# Patient Record
Sex: Male | Born: 1965 | Hispanic: No | Marital: Married | Smoking: Never smoker
Health system: Southern US, Community
[De-identification: ages and names within clinical notes are randomized; demographics above are authoritative.]

## PROBLEM LIST (undated history)

## (undated) DIAGNOSIS — I10 Essential (primary) hypertension: Secondary | ICD-10-CM

## (undated) DIAGNOSIS — J309 Allergic rhinitis, unspecified: Secondary | ICD-10-CM

## (undated) DIAGNOSIS — F32A Depression, unspecified: Secondary | ICD-10-CM

## (undated) DIAGNOSIS — K219 Gastro-esophageal reflux disease without esophagitis: Secondary | ICD-10-CM

## (undated) DIAGNOSIS — F329 Major depressive disorder, single episode, unspecified: Secondary | ICD-10-CM

## (undated) DIAGNOSIS — J302 Other seasonal allergic rhinitis: Secondary | ICD-10-CM

## (undated) HISTORY — DX: Other seasonal allergic rhinitis: J30.2

## (undated) HISTORY — DX: Allergic rhinitis, unspecified: J30.9

## (undated) HISTORY — DX: Depression, unspecified: F32.A

## (undated) HISTORY — DX: Gastro-esophageal reflux disease without esophagitis: K21.9

## (undated) HISTORY — DX: Major depressive disorder, single episode, unspecified: F32.9

## (undated) HISTORY — DX: Essential (primary) hypertension: I10

---

## 1998-06-16 ENCOUNTER — Emergency Department (HOSPITAL_COMMUNITY): Admission: EM | Admit: 1998-06-16 | Discharge: 1998-06-16 | Payer: Self-pay | Admitting: Emergency Medicine

## 1999-12-20 ENCOUNTER — Emergency Department (HOSPITAL_COMMUNITY): Admission: EM | Admit: 1999-12-20 | Discharge: 1999-12-21 | Payer: Self-pay | Admitting: Emergency Medicine

## 2001-08-02 ENCOUNTER — Encounter: Payer: Self-pay | Admitting: Emergency Medicine

## 2001-08-02 ENCOUNTER — Emergency Department (HOSPITAL_COMMUNITY): Admission: EM | Admit: 2001-08-02 | Discharge: 2001-08-02 | Payer: Self-pay | Admitting: Emergency Medicine

## 2003-01-25 ENCOUNTER — Emergency Department (HOSPITAL_COMMUNITY): Admission: EM | Admit: 2003-01-25 | Discharge: 2003-01-25 | Payer: Self-pay | Admitting: Emergency Medicine

## 2003-01-25 ENCOUNTER — Encounter: Payer: Self-pay | Admitting: Emergency Medicine

## 2005-01-22 ENCOUNTER — Ambulatory Visit: Payer: Self-pay | Admitting: Internal Medicine

## 2005-06-10 ENCOUNTER — Ambulatory Visit: Payer: Self-pay | Admitting: Internal Medicine

## 2005-12-22 ENCOUNTER — Ambulatory Visit: Payer: Self-pay | Admitting: Internal Medicine

## 2006-03-05 ENCOUNTER — Ambulatory Visit: Payer: Self-pay | Admitting: Internal Medicine

## 2006-05-14 ENCOUNTER — Ambulatory Visit: Payer: Self-pay | Admitting: Internal Medicine

## 2006-11-25 ENCOUNTER — Emergency Department (HOSPITAL_COMMUNITY): Admission: EM | Admit: 2006-11-25 | Discharge: 2006-11-25 | Payer: Self-pay | Admitting: Emergency Medicine

## 2008-12-31 ENCOUNTER — Emergency Department (HOSPITAL_COMMUNITY): Admission: EM | Admit: 2008-12-31 | Discharge: 2008-12-31 | Payer: Self-pay | Admitting: Emergency Medicine

## 2008-12-31 ENCOUNTER — Ambulatory Visit: Payer: Self-pay | Admitting: Cardiovascular Disease

## 2009-01-03 ENCOUNTER — Ambulatory Visit: Payer: Self-pay

## 2009-01-07 ENCOUNTER — Ambulatory Visit: Payer: Self-pay | Admitting: Cardiovascular Disease

## 2009-09-20 ENCOUNTER — Telehealth: Payer: Self-pay | Admitting: Cardiovascular Disease

## 2010-06-17 ENCOUNTER — Telehealth: Payer: Self-pay | Admitting: Cardiovascular Disease

## 2011-01-11 ENCOUNTER — Encounter: Payer: Self-pay | Admitting: Internal Medicine

## 2011-01-20 NOTE — Progress Notes (Signed)
Summary: med question   Phone Note Call from Patient Call back at [phone]   Caller: Patient Reason for Call: Talk to Nurse Summary of Call: request to speak to nurse about med...Marland KitchenMarland KitchenAMLODIPINE BESYLATE 10 MG TABS, having temple and jaw pain, losing weight.... ongoing 2 weeks Initial call taken by: Migdalia Dk,  June 17, 2010 3:42 PM  Follow-up for Phone Call        left message to call back Dossie Arbour, RN, BSN  June 17, 2010 5:05 PM Spoke with pt who reports he saw a diet MD in [place] who started him on phendimetrazine. Pt. took it for 3 days and then stopped it about a week ago.  Pt. stopped Norvasc about a month ago and then resumed it about 2 weeks ago.  States he has been having pressure in temple area for about  2 weeks.  He has taken blood pressure and it runs 125/82 - 138/91.  Blood pressure today 133/88.  Pt has no primary care.  Pt states he can go see Dr. Earlene Plater at [facility].  Discussed with Dr. Clifton James and I instructed pt he should go to Urgent Care to have bp checked and to be evalulated. He is agreeable with this plan and will go to urgent care in AM.  Follow-up by: Dossie Arbour, RN, BSN,  June 17, 2010 5:42 PM

## 2011-04-06 LAB — DIFFERENTIAL
Basophils Relative: 0 % (ref 0–1)
Eosinophils Absolute: 0.5 10*3/uL (ref 0.0–0.7)
Eosinophils Relative: 5 % (ref 0–5)
Monocytes Absolute: 0.5 10*3/uL (ref 0.1–1.0)
Monocytes Relative: 5 % (ref 3–12)
Neutro Abs: 5.6 10*3/uL (ref 1.7–7.7)

## 2011-04-06 LAB — HEPATIC FUNCTION PANEL
AST: 34 U/L (ref 0–37)
Bilirubin, Direct: 0.1 mg/dL (ref 0.0–0.3)
Indirect Bilirubin: 0.7 mg/dL (ref 0.3–0.9)
Total Bilirubin: 0.8 mg/dL (ref 0.3–1.2)

## 2011-04-06 LAB — POCT I-STAT, CHEM 8
Creatinine, Ser: 1.1 mg/dL (ref 0.4–1.5)
Hemoglobin: 16 g/dL (ref 13.0–17.0)
Sodium: 141 mEq/L (ref 135–145)
TCO2: 29 mmol/L (ref 0–100)

## 2011-04-06 LAB — CBC
HCT: 45.7 % (ref 39.0–52.0)
Hemoglobin: 15.6 g/dL (ref 13.0–17.0)
MCHC: 34 g/dL (ref 30.0–36.0)
MCV: 90.3 fL (ref 78.0–100.0)
Platelets: 332 10*3/uL (ref 150–400)
RBC: 5.06 MIL/uL (ref 4.22–5.81)
RDW: 13.5 % (ref 11.5–15.5)
WBC: 9 10*3/uL (ref 4.0–10.5)

## 2011-04-06 LAB — POCT CARDIAC MARKERS
CKMB, poc: <1 ng/mL — ABNORMAL LOW (ref 1.0–8.0)
Myoglobin, poc: 46.3 ng/mL (ref 12–200)
Myoglobin, poc: 63 ng/mL (ref 12–200)

## 2011-05-05 NOTE — Consult Note (Signed)
[name], [name]                ACCOUNT NO.:  [id number]   MEDICAL RECORD NO.:  [id number]          PATIENT TYPE:  EMS   LOCATION:  ED                           FACILITY[facility]  [facility]   PHYSICIAN:  Verne Carrow, MDDATE OF BIRTH:  [birth year]   DATE OF CONSULTATION:  12/31/2008  DATE OF DISCHARGE:                                 CONSULTATION   REASON FOR CONSULTATION:  Chest pain.   HISTORY OF PRESENT ILLNESS:  [name] is a pleasant 45 year old  Hispanic male with no prior cardiac history who presented to the [facility] emergency department today with complaints of mild substernal chest  pressure that started last night.  Patient tells me that he had been in  his normal state of good health and was lying in bed last night when he  had the onset of substernal chest pressure.  He did have some dizziness  but had no associated shortness of breath, diaphoresis, palpitations,  near syncope or syncope.  He says that the pain lasted for several hours  and then he feel asleep.  When he awakened this morning he also noticed  some discomfort in his substernal region.  The pain does seem to worsen  by moving his left arm.  He tells me that he had lifted some heavy boxes  yesterday and feels that maybe this is related to a strained muscle.  He  does have a history of gastroesophageal reflux disease in the past and  states that this pain feels similar to that.  In the emergency  department his first set of cardiac enzymes have been negative.  His EKG  shows normal sinus rhythm without any evidence of ischemia.  Of note,  his systolic blood pressure has been elevated in the emergency  department and has ranged between 148 and 170.  Patient is currently  comfortable and is denying any significant chest pain.   PAST MEDICAL HISTORY:  1. Seasonal allergies.  2. Gastroesophageal reflux disease.  3. No prior history of hypertension, hyperlipidemia, diabetes      mellitus, tobacco use or  cardiac issues.   PAST SURGICAL HISTORY:  None.   ALLERGIES:  No known drug allergies.   HOME MEDICATIONS:  Allegra as needed.   SOCIAL HISTORY:  The patient lives in [place] with his wife and is  employed as a Corporate investment banker.  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  He denies the use of tobacco, alcohol, or  illicit drugs.   FAMILY HISTORY:  The patient's mother is alive and healthy.  He is not  aware of his father's health history.  There is no family history of  coronary artery disease or sudden cardiac death.   REVIEW OF SYSTEMS:  As stated in the history of present illness, is  otherwise negative.  A detailed 12-point review of systems is outlined  in the patient's written medical record.   PHYSICAL EXAMINATION:  VITAL SIGNS:  Temperature 98.0, pulse 71 and  regular, respirations 12 and unlabored, blood pressure 148/96.  GENERAL:  He is a pleasant, young, Hispanic  male in no acute distress.  He is alert and oriented x3.  PSYCHIATRIC:  Mood and affect are appropriate.  MUSCULOSKELETAL:  Muscle strength and tone is normal.  NEUROLOGIC:  No focal neurological deficits.  SKIN:  Warm and dry.  HEENT:  Normal.  NECK:  No JVD.  No carotid bruits.  No thyromegaly.  No lymphadenopathy.  LUNGS:  Clear to auscultation bilaterally without wheezes, rhonchi, or  crackles noted.  CARDIOVASCULAR:  Regular rate and rhythm without murmurs, gallops, or  rubs noted.  ABDOMEN:  Soft, nontender.  Bowel sounds are present.  EXTREMITIES:  No evidence of edema.  Pulses are at 2+ in all  extremities.  CHEST WALL:  There is reproducible chest pain by moving the patient's  arm up and down.   DIAGNOSTIC STUDIES:  1. A 12-lead EKG shows normal sinus rhythm with a ventricular rate of      83 beats per minute and no evidence of ischemia.  2. Laboratory values obtained at the time of admission show white      blood cell count of 9, hemoglobin 16, platelets 332.  Sodium  141,      potassium 3.7, BUN 11, creatinine 1.1.  Troponin less than 0.05.      CK-MB less than 1.0, lipase 16.   ASSESSMENT/PLAN:  Chest pain.  This patient's chest pain has many  atypical features.  It is most likely that this is musculoskeletal or  gastrointestinal in nature.  I cannot exclude the possibility of  underlying heart disease, although the patient has no known risk factors  for coronary artery disease.  Blood pressure is elevated today and this  suggests undiagnosed hypertension.  He has not had a recent workup to  look at his fasting lipid state.  I see no current evidence of ischemia  in his electrocardiogram and his first set of cardiac enzymes have been  negative.  I think it will be reasonable to discharge him to home from  the emergency department.  We have scheduled him to have an outpatient  stress test in our office at [facility] on Thursday, January 03, 2009 at 7:45 a.m.  I will have the patient follow up with me in my  office on Monday, January 07, 2009 at 11:15 a.m.  I have spoken to the  patient and his wife with this plan and they are in agreement.  I have  also suggested that patient obtain Prilosec over the counter and take  this on a daily basis to see if there is any resolution in his symptoms.  This plan was also discussed with Dr. Terressa Koyanagi who is on the  emergency department staff here at [facility].      Verne Carrow, MD  Electronically Signed     CM/MEDQ  D:  12/31/2008  T:  12/31/2008  Job:  [id number]

## 2011-05-05 NOTE — Assessment & Plan Note (Signed)
[facility]                            CARDIOLOGY OFFICE NOTE   NAME:REYESMayan, Kloepfer                         MRN:          [id number]  DATE:01/07/2009                            DOB:          06-Mar-1966    PRIMARY CARE PHYSICIAN:  None.   HISTORY OF PRESENT ILLNESS:  Mr. Padget is a pleasant 45 year old  Hispanic male with no prior cardiac history, who I was asked to see in  the [facility] Emergency Department on December 31, 2008.  The patient  presented to the emergency department with complaints of mild substernal  chest pressure as well as dizziness.  He at that time denied any  associated shortness of breath, diaphoresis, palpitations, near syncope,  or syncope.  He tells me that the chest pain started the night before  several hours before he went to sleep.  He went to sleep and had no  problems during the night.  When he awoke the next morning, he had  return of some of the chest discomfort.  He tells me at that time the  pain seemed to worsen when he moved his left arm.  He had been lifting  some heavy boxes the day before and felt that this was most likely  related to muscle strain, however, he wanted to have this evaluated.  He  was evaluated in the emergency department and Cardiology was consulted  for further recommendations.  His first set of cardiac enzymes were  negative in the emergency department.  His EKG showed normal sinus  rhythm without any evidence of ischemia.  I felt that it would be okay  to let him go home and have him come in for an outpatient stress test.  He had his Myoview performed on January 03, 2009.  The patient had an  adenosine test secondary to his blood pressure being elevated when he  arrived for his stress test.  His perfusion images showed no evidence of  ischemia.  His ejection fraction was noted to be 64%.  At the time of  his stress test, his blood pressure was 157/102.   The patient comes in today to review  the results of his stress test.  He  tells me that his chest pain has resolved since he was in the emergency  department.  He no longer has any pain when he moves his left or right  arms.  He also tells me that he has been drinking more water as  instructed by me in the emergency department and has had no dizziness.  He is symptom free at the current time.  He denies any dyspnea with  exertion, palpitations, dizziness, diaphoresis, near syncope, syncope,  orthopnea, PND, lower extremity edema, or chest pain.   PAST MEDICAL HISTORY:  1. Seasonal allergies.  2. GERD.  3. No prior history of hypertension, hyperlipidemia, diabetes      mellitus, tobacco use, or cardiac issues.   PAST SURGICAL HISTORY:  None.   ALLERGIES:  No known drug allergies.   HOME MEDICATIONS:  1. Allegra as needed.  2. Over-the-counter Prilosec.  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  He denies use of tobacco, alcohol, or illicit  drugs.   FAMILY HISTORY:  The patient's mother is alive and healthy.  He is not  aware of his father's health history.  There is no family history of  coronary artery disease or sudden cardiac death.   REVIEW OF SYSTEMS:  As stated in the history of present illness, is  otherwise negative.   PHYSICAL EXAMINATION:  VITAL SIGNS:  Blood pressure 150/100, pulse 76  and regular, and respirations 12 and unlabored.  GENERAL:  He is a pleasant, middle-aged Hispanic male in no acute  distress.  He is alert and oriented x3.  PSYCHIATRIC:  Mood and affect are appropriate.  NECK:  No JVD.  No carotid bruits.  No thyromegaly.  No lymphadenopathy.  SKIN:  Warm and dry.  LUNGS:  Clear to auscultation bilaterally without wheezes, rhonchi, or  crackles noted.  CARDIOVASCULAR:  Regular rate and rhythm without murmurs, gallops, or  rubs noted.  ABDOMEN:  Soft, nontender,  and nondistended.  Bowel sounds are present.  EXTREMITIES:  No evidence of edema.  Pulses are 2+ in all extremities.   DIAGNOSTIC STUDIES:  Adenosine Myoview performed on January 03, 2009,  showed no evidence of myocardial ischemia.  There was normal  contractility and thickening in all areas of myocardium.  The overall  left ventricular function was normal with an ejection fraction of 64%.  The patient's blood pressure at the time of the study was 157/102.  The  patient did have mild chest discomfort with infusion of the adenosine.  There were no EKG changes.  They were suggestive of ischemia.   ASSESSMENT AND PLAN:  This is a pleasant 45 year old Hispanic male with  no prior cardiac history, who returns today for review of an adenosine  Myoview stress test.  The stress test showed no evidence of myocardial  ischemia.  The patient's chest pain was most likely musculoskeletal and  has not recurred over the last week.  He does continue to have an  elevated blood pressure.  This has been documented in the emergency  department as well as during the day of his stress test and once again  today.  I have talked to the patient about finding a primary care  physician.  He has made plans to do this, this week.  It will be  important for him to be followed for his routine health screening as  well as to have his blood pressure followed closely.  I will start  Norvasc 5 mg once daily today.  The patient will have followup blood  work in the office of his primary care physician once this is  established.  I do not see any reason for him to be followed in our  office, but it was told him if he is unable to locate a primary care  physician, then we will be glad to follow him for his blood pressure in  our office.  The patient is  accompanied today by his wife and they both are in agreement to this  plan.  The patient will be seen in this office on an as-needed basis  unless he needs for [place] to follow  him more closely.     Verne Carrow, MD  Electronically Signed    CM/MedQ  DD: 01/07/2009  DT: 01/08/2009  Job #: [id number]

## 2011-06-26 ENCOUNTER — Telehealth: Payer: Self-pay | Admitting: Internal Medicine

## 2011-06-26 NOTE — Telephone Encounter (Signed)
Forwarded to Dr. Jonny Ruiz for patient's appt on 07-01-11.

## 2011-07-01 ENCOUNTER — Encounter: Payer: Self-pay | Admitting: Internal Medicine

## 2011-07-01 ENCOUNTER — Ambulatory Visit (INDEPENDENT_AMBULATORY_CARE_PROVIDER_SITE_OTHER): Payer: Self-pay | Admitting: Internal Medicine

## 2011-07-01 ENCOUNTER — Other Ambulatory Visit (INDEPENDENT_AMBULATORY_CARE_PROVIDER_SITE_OTHER): Payer: Self-pay

## 2011-07-01 ENCOUNTER — Other Ambulatory Visit: Payer: Self-pay | Admitting: Internal Medicine

## 2011-07-01 VITALS — BP 110/80 | HR 75 | Temp 98.3°F | Ht 67.0 in | Wt 209.4 lb

## 2011-07-01 DIAGNOSIS — Z0000 Encounter for general adult medical examination without abnormal findings: Secondary | ICD-10-CM | POA: Insufficient documentation

## 2011-07-01 DIAGNOSIS — R7309 Other abnormal glucose: Secondary | ICD-10-CM

## 2011-07-01 DIAGNOSIS — R7302 Impaired glucose tolerance (oral): Secondary | ICD-10-CM

## 2011-07-01 DIAGNOSIS — N200 Calculus of kidney: Secondary | ICD-10-CM | POA: Insufficient documentation

## 2011-07-01 DIAGNOSIS — F329 Major depressive disorder, single episode, unspecified: Secondary | ICD-10-CM

## 2011-07-01 DIAGNOSIS — I10 Essential (primary) hypertension: Secondary | ICD-10-CM

## 2011-07-01 DIAGNOSIS — J301 Allergic rhinitis due to pollen: Secondary | ICD-10-CM | POA: Insufficient documentation

## 2011-07-01 DIAGNOSIS — J309 Allergic rhinitis, unspecified: Secondary | ICD-10-CM

## 2011-07-01 DIAGNOSIS — K219 Gastro-esophageal reflux disease without esophagitis: Secondary | ICD-10-CM

## 2011-07-01 DIAGNOSIS — E785 Hyperlipidemia, unspecified: Secondary | ICD-10-CM | POA: Insufficient documentation

## 2011-07-01 HISTORY — DX: Allergic rhinitis, unspecified: J30.9

## 2011-07-01 HISTORY — DX: Essential (primary) hypertension: I10

## 2011-07-01 LAB — LIPID PANEL
Cholesterol: 230 mg/dL — ABNORMAL HIGH (ref 0–200)
VLDL: 29.8 mg/dL (ref 0.0–40.0)

## 2011-07-01 LAB — BASIC METABOLIC PANEL
BUN: 14 mg/dL (ref 6–23)
CO2: 30 mEq/L (ref 19–32)
Chloride: 102 mEq/L (ref 96–112)
Creatinine, Ser: 0.9 mg/dL (ref 0.4–1.5)

## 2011-07-01 LAB — HEMOGLOBIN A1C: Hgb A1c MFr Bld: 6 % (ref 4.6–6.5)

## 2011-07-01 MED ORDER — AMLODIPINE BESYLATE 10 MG PO TABS: 10.0000 mg | ORAL_TABLET | Freq: Every day | ORAL | Status: DC

## 2011-07-01 MED ORDER — FEXOFENADINE HCL 180 MG PO TABS: 180.0000 mg | ORAL_TABLET | Freq: Every day | ORAL | Status: DC

## 2011-07-01 NOTE — Assessment & Plan Note (Signed)
asympt   - for a1c today

## 2011-07-01 NOTE — Assessment & Plan Note (Deleted)

## 2011-07-01 NOTE — Assessment & Plan Note (Signed)
Prior resolved, no need tx at this time

## 2011-07-01 NOTE — Assessment & Plan Note (Signed)
stable overall by hx and exam, most recent data reviewed with pt, and pt to continue medical treatment as before  BP Readings from Last 3 Encounters:  07/01/11 110/80

## 2011-07-01 NOTE — Assessment & Plan Note (Signed)
asymtp - no meds needed at this time

## 2011-07-01 NOTE — Progress Notes (Signed)
Subjective:    Patient ID: [name], male    DOB: 09-28-1966, 45 y.o.   MRN: [id number]  HPI  Here for wellness and f/u;  Overall doing ok;  Pt denies CP, worsening SOB, DOE, wheezing, orthopnea, PND, worsening LE edema, palpitations, dizziness or syncope.  Pt denies neurological change such as new Headache, facial or extremity weakness.  Pt denies polydipsia, polyuria, or low sugar symptoms. Pt states overall good compliance with treatment and medications, good tolerability, and trying to follow lower cholesterol diet.  Pt denies worsening depressive symptoms, suicidal ideation or panic. No fever, wt loss, night sweats, loss of appetite, or other constitutional symptoms.  Pt states good ability with ADL's, low fall risk, home safety reviewed and adequate, no significant changes in hearing or vision, and occasionally active with exercise. Also mentions onset cough for 3 wks - ? Related to allergy? As he have run outside in the AM to help lose wt , but sometimes misses the allegra as well. Has lost 3-4 lbs already with running.  Pt denies fever, wt loss, night sweats, loss of appetite, or other constitutional symptoms Past Medical History  Diagnosis Date  . Seasonal allergies   . GERD (gastroesophageal reflux disease)   . Depression   . Hypertension   . HTN (hypertension) 07/01/2011  . Allergic rhinitis, cause unspecified 07/01/2011   No past surgical history on file.  reports that he has never smoked. He does not have any smokeless tobacco history on file. He reports that he does not drink alcohol or use illicit drugs. family history includes Hypertension in his father and mother.  There is no history of Coronary artery disease. No Known Allergies Current Outpatient Prescriptions on File Prior to Visit  Medication Sig Dispense Refill  . amLODipine (NORVASC) 10 MG tablet Take 10 mg by mouth daily.         Review of Systems Review of Systems  Constitutional: Negative for diaphoresis,  activity change, appetite change and unexpected weight change.  HENT: Negative for hearing loss, ear pain, facial swelling, mouth sores and neck stiffness.   Eyes: Negative for pain, redness and visual disturbance.  Respiratory: Negative for shortness of breath and wheezing.   Cardiovascular: Negative for chest pain and palpitations.  Gastrointestinal: Negative for diarrhea, blood in stool, abdominal distention and rectal pain.  Genitourinary: Negative for hematuria, flank pain and decreased urine volume.  Musculoskeletal: Negative for myalgias and joint swelling.  Skin: Negative for color change and wound.  Neurological: Negative for syncope and numbness.  Hematological: Negative for adenopathy.  Psychiatric/Behavioral: Negative for hallucinations, self-injury, decreased concentration and agitation.      Objective:   Physical Exam BP 110/80  Pulse 75  Temp(Src) 98.3 F (36.8 C) (Oral)  Ht 5\' 7"  (1.702 m)  Wt 209 lb 6 oz (94.972 kg)  BMI 32.79 kg/m2  SpO2 95% Physical Exam  VS noted Constitutional: Pt is oriented to person, place, and time. Appears well-developed and well-nourished.  HENT:  Head: Normocephalic and atraumatic.  Right Ear: External ear normal.  Left Ear: External ear normal.  Nose: Nose normal.  Mouth/Throat: Oropharynx is clear and moist.  Eyes: Conjunctivae and EOM are normal. Pupils are equal, round, and reactive to light.  Neck: Normal range of motion. Neck supple. No JVD present. No tracheal deviation present.  Cardiovascular: Normal rate, regular rhythm, normal heart sounds and intact distal pulses.   Pulmonary/Chest: Effort normal and breath sounds normal.  Abdominal: Soft. Bowel sounds are normal. There is  no tenderness.  Musculoskeletal: Normal range of motion. Exhibits no edema.  Lymphadenopathy:  Has no cervical adenopathy.  Neurological: Pt is alert and oriented to person, place, and time. Pt has normal reflexes. No cranial nerve deficit.  Skin: Skin  is warm and dry. No rash noted.  Psychiatric:  Has  normal mood and affect. Behavior is normal.         Assessment & Plan:

## 2011-07-01 NOTE — Assessment & Plan Note (Signed)
stable overall by hx and exam, , and pt to continue medical treatment as before   

## 2011-07-01 NOTE — Patient Instructions (Addendum)
Continue all other medications as before Please go to LAB in the Basement for the blood and/or urine tests to be done today Please call the phone number [phone] (the PhoneTree System) for results of testing in 2-3 days;  When calling, simply dial the number, and when prompted enter the MRN number above (the Medical Record Number) and the # key, then the message should start. Please return in 1 yr, or sooner if needed

## 2011-07-09 ENCOUNTER — Telehealth: Payer: Self-pay | Admitting: *Deleted

## 2011-07-09 DIAGNOSIS — E7800 Pure hypercholesterolemia, unspecified: Secondary | ICD-10-CM

## 2011-07-09 DIAGNOSIS — Z79899 Other long term (current) drug therapy: Secondary | ICD-10-CM

## 2011-07-09 NOTE — Telephone Encounter (Signed)
Pt inquiring about Rx for Lipitor.? Not on med list/will clarify w/Pt.

## 2011-07-11 ENCOUNTER — Emergency Department (HOSPITAL_COMMUNITY): Payer: Self-pay

## 2011-07-11 ENCOUNTER — Emergency Department (HOSPITAL_COMMUNITY)
Admission: EM | Admit: 2011-07-11 | Discharge: 2011-07-11 | Disposition: A | Discharge status: Home / Self Care | Payer: Self-pay | Attending: Emergency Medicine | Admitting: Emergency Medicine

## 2011-07-11 DIAGNOSIS — R109 Unspecified abdominal pain: Secondary | ICD-10-CM | POA: Insufficient documentation

## 2011-07-11 DIAGNOSIS — F411 Generalized anxiety disorder: Secondary | ICD-10-CM | POA: Insufficient documentation

## 2011-07-11 DIAGNOSIS — N201 Calculus of ureter: Secondary | ICD-10-CM | POA: Insufficient documentation

## 2011-07-11 LAB — URINALYSIS, ROUTINE W REFLEX MICROSCOPIC
Bilirubin Urine: NEGATIVE
Glucose, UA: NEGATIVE mg/dL
Ketones, ur: NEGATIVE mg/dL
pH: 7 (ref 5.0–8.0)

## 2011-07-11 LAB — URINE MICROSCOPIC-ADD ON

## 2011-07-22 NOTE — Telephone Encounter (Signed)
No hx of elev cholesterol, and no lipids done at [facility], so unless there is a record of prior tx (with records to show this, I could not prescribe)

## 2011-07-22 NOTE — Telephone Encounter (Signed)
Pt had labs done July 2012 and was advised by MD to consider Lipitor for elevated cholesterol, please advise

## 2011-07-22 NOTE — Telephone Encounter (Signed)
Sorry, he is correct, I missed the lipids just done July 2012  [facility] for lipitor 10 qd;  To f/u with labs only in 4 wks:  Lipids, 272.0, hepatic function panel v58.69 in 4 wks  Dahlia to do new rx, and arrange labs, and contact pt please

## 2011-07-23 MED ORDER — ATORVASTATIN CALCIUM 20 MG PO TABS: 20.0000 mg | ORAL_TABLET | Freq: Every day | ORAL | Status: DC

## 2011-07-23 NOTE — Telephone Encounter (Signed)
Pt advised of Rx/pharmacy as well as 4 week lab. Order placed.

## 2011-09-03 ENCOUNTER — Telehealth: Payer: Self-pay

## 2011-09-03 MED ORDER — LOVASTATIN 40 MG PO TABS: 40.0000 mg | ORAL_TABLET | Freq: Every day | ORAL | Status: DC

## 2011-09-03 NOTE — Telephone Encounter (Signed)
Patient would like alternative to Lipitor as too expensive. [facility] is his pharmacy.

## 2011-09-03 NOTE — Telephone Encounter (Signed)
Ok to change to [employer] - done per [employer]

## 2011-09-04 NOTE — Telephone Encounter (Signed)
Called patient informed prescription requested has been sent to pharmacy 

## 2012-07-19 ENCOUNTER — Other Ambulatory Visit: Payer: Self-pay | Admitting: Internal Medicine

## 2012-07-28 ENCOUNTER — Emergency Department (HOSPITAL_COMMUNITY)
Admission: EM | Admit: 2012-07-28 | Discharge: 2012-07-28 | Disposition: A | Discharge status: Home / Self Care | Payer: Self-pay | Attending: Emergency Medicine | Admitting: Emergency Medicine

## 2012-07-28 ENCOUNTER — Encounter (HOSPITAL_COMMUNITY): Payer: Self-pay | Admitting: Emergency Medicine

## 2012-07-28 DIAGNOSIS — F3289 Other specified depressive episodes: Secondary | ICD-10-CM | POA: Insufficient documentation

## 2012-07-28 DIAGNOSIS — F329 Major depressive disorder, single episode, unspecified: Secondary | ICD-10-CM | POA: Insufficient documentation

## 2012-07-28 DIAGNOSIS — I10 Essential (primary) hypertension: Secondary | ICD-10-CM | POA: Insufficient documentation

## 2012-07-28 DIAGNOSIS — Z79899 Other long term (current) drug therapy: Secondary | ICD-10-CM | POA: Insufficient documentation

## 2012-07-28 DIAGNOSIS — L237 Allergic contact dermatitis due to plants, except food: Secondary | ICD-10-CM

## 2012-07-28 DIAGNOSIS — L255 Unspecified contact dermatitis due to plants, except food: Secondary | ICD-10-CM | POA: Insufficient documentation

## 2012-07-28 DIAGNOSIS — K219 Gastro-esophageal reflux disease without esophagitis: Secondary | ICD-10-CM | POA: Insufficient documentation

## 2012-07-28 MED ORDER — PREDNISONE 10 MG PO TABS: 20.0000 mg | ORAL_TABLET | Freq: Every day | ORAL | Status: DC

## 2012-07-28 MED ORDER — HYDROXYZINE HCL 25 MG PO TABS
25.0000 mg | ORAL_TABLET | Freq: Once | ORAL | Status: AC
  Administered 2012-07-28: 25 mg via ORAL
  Filled 2012-07-28: qty 1

## 2012-07-28 MED ORDER — HYDROXYZINE HCL 25 MG PO TABS: 25.0000 mg | ORAL_TABLET | Freq: Four times a day (QID) | ORAL | Status: AC

## 2012-07-28 NOTE — ED Provider Notes (Signed)
History     CSN: [id number]  Arrival date & time 07/28/12  0049   First MD Initiated Contact with Patient 07/28/12 0143      Chief Complaint  Patient presents with  . Poison Ivy    (Consider location/radiation/quality/duration/timing/severity/associated sxs/prior treatment) HPI Comments: Patient with no significant past medical history presents emergency department with chief complaint of rash.  He states that him and his wife were doing yard work last week and eventually developed a very itchy, erythematous, pruritic rash on exposed areas including ankles and forearms.  In time patient states that he developed a rash in his groin area.  He denies fevers, night sweats, chills, myalgias, headache, nausea, vomiting, neck pain.  The history is provided by the patient.    Past Medical History  Diagnosis Date  . Seasonal allergies   . GERD (gastroesophageal reflux disease)   . Depression   . Hypertension   . HTN (hypertension) 07/01/2011  . Allergic rhinitis, cause unspecified 07/01/2011    History reviewed. No pertinent past surgical history.  Family History  Problem Relation Age of Onset  . Coronary artery disease Neg Hx   . Hypertension Mother   . Hypertension Father     History  Substance Use Topics  . Smoking status: Never Smoker   . Smokeless tobacco: Not on file  . Alcohol Use: No      Review of Systems  All other systems reviewed and are negative.    Allergies  Review of patient's allergies indicates no known allergies.  Home Medications   Current Outpatient Rx  Name Route Sig Dispense Refill  . AMLODIPINE BESYLATE 10 MG PO TABS  TAKE 1 TABLET (10 MG TOTAL) BY MOUTH DAILY. 90 tablet 0    Patient needs to schedule office visit.  Marland Kitchen FEXOFENADINE HCL 180 MG PO TABS Oral Take 1 tablet (180 mg total) by mouth daily. 90 tablet 3    BP 141/89  Pulse 82  Temp 98 F (36.7 C)  Resp 16  SpO2 99%  Physical Exam  Nursing note and vitals  reviewed. Constitutional: He is oriented to person, place, and time. He appears well-developed and well-nourished. No distress.  HENT:  Head: Normocephalic and atraumatic.  Mouth/Throat: Oropharynx is clear and moist and mucous membranes are normal.       No sign of airway obstruction. No edema of face, eyelids, lips, tongue, uvula.Marland Kitchen Uvula midline, no nasal congestion or drooling.  Tongue not elevated. No trismus.  Neck: Trachea normal, normal range of motion and full passive range of motion without pain. Neck supple. Carotid bruit is not present. No tracheal deviation present.       No carotid bruits or stridor  Cardiovascular: Normal rate, regular rhythm, intact distal pulses and normal pulses.        Not tachycardic  Pulmonary/Chest: Effort normal. No stridor.  Musculoskeletal: Normal range of motion.  Neurological: He is alert and oriented to person, place, and time.  Skin: Skin is warm and intact. Rash noted. Rash is pustular and vesicular. He is not diaphoretic.       Not diaphoretic. Raised erythematous, wheeping , pruritic in nature, located groin, ankles bilaterally and posterior forearms. no petechiae or purpura.   Psychiatric: He has a normal mood and affect. His behavior is normal.    ED Course  Procedures (including critical care time)  Labs Reviewed - No data to display No results found.   No diagnosis found.    MDM  Poison IVY  Pt presentation consistant with poison ivy infection. Discussed contagiousness & home care. Treated in ED w atarax. DC with recommendation to get OTC Zanfel. Script for PO prednisone taper Strict return precautions discussed. Pt afebrile and in NAD prior to dc. Airway intact without compromise.NO airway compromise.       Jaci Carrel, [place] 07/28/12 0234

## 2012-07-28 NOTE — ED Notes (Signed)
Pt alert, arrives from home, c/o rash to arms, legs, genitals, onset was yesterday, believed to be exposure to poison ivy, resp even unlabored, skin pwd

## 2012-07-29 NOTE — ED Provider Notes (Signed)
Medical screening examination/treatment/procedure(s) were performed by non-physician practitioner and as supervising physician I was immediately available for consultation/collaboration.  Mirza Fessel, MD 07/29/12 0847 

## 2012-11-12 ENCOUNTER — Other Ambulatory Visit: Payer: Self-pay | Admitting: Internal Medicine

## 2013-01-12 ENCOUNTER — Emergency Department (HOSPITAL_COMMUNITY)
Admission: EM | Admit: 2013-01-12 | Discharge: 2013-01-13 | Disposition: A | Discharge status: Home / Self Care | Payer: Self-pay | Attending: Emergency Medicine | Admitting: Emergency Medicine

## 2013-01-12 ENCOUNTER — Encounter (HOSPITAL_COMMUNITY): Payer: Self-pay | Admitting: *Deleted

## 2013-01-12 DIAGNOSIS — Z8719 Personal history of other diseases of the digestive system: Secondary | ICD-10-CM | POA: Insufficient documentation

## 2013-01-12 DIAGNOSIS — Z91199 Patient's noncompliance with other medical treatment and regimen due to unspecified reason: Secondary | ICD-10-CM | POA: Insufficient documentation

## 2013-01-12 DIAGNOSIS — R51 Headache: Secondary | ICD-10-CM | POA: Insufficient documentation

## 2013-01-12 DIAGNOSIS — Z9119 Patient's noncompliance with other medical treatment and regimen: Secondary | ICD-10-CM | POA: Insufficient documentation

## 2013-01-12 DIAGNOSIS — Z79899 Other long term (current) drug therapy: Secondary | ICD-10-CM | POA: Insufficient documentation

## 2013-01-12 DIAGNOSIS — I10 Essential (primary) hypertension: Secondary | ICD-10-CM | POA: Insufficient documentation

## 2013-01-12 DIAGNOSIS — Z8659 Personal history of other mental and behavioral disorders: Secondary | ICD-10-CM | POA: Insufficient documentation

## 2013-01-12 DIAGNOSIS — Z9114 Patient's other noncompliance with medication regimen: Secondary | ICD-10-CM

## 2013-01-12 NOTE — ED Notes (Signed)
Pt c/o headache all day; feels like his heart beating in his head; has not taken bp pill x 3 days because can't afford bp pills; increased pain when leans forward

## 2013-01-13 LAB — POCT I-STAT, CHEM 8
Creatinine, Ser: 1 mg/dL (ref 0.50–1.35)
Glucose, Bld: 129 mg/dL — ABNORMAL HIGH (ref 70–99)
Hemoglobin: 16.3 g/dL (ref 13.0–17.0)
Sodium: 142 mEq/L (ref 135–145)
TCO2: 28 mmol/L (ref 0–100)

## 2013-01-13 MED ORDER — ACETAMINOPHEN 325 MG PO TABS
650.0000 mg | ORAL_TABLET | Freq: Once | ORAL | Status: AC
  Administered 2013-01-13: 650 mg via ORAL
  Filled 2013-01-13: qty 2

## 2013-01-13 MED ORDER — AMLODIPINE BESYLATE 10 MG PO TABS: 10.0000 mg | ORAL_TABLET | Freq: Every day | ORAL | Status: DC

## 2013-01-13 MED ORDER — LISINOPRIL-HYDROCHLOROTHIAZIDE 10-12.5 MG PO TABS: 1.0000 | ORAL_TABLET | Freq: Every day | ORAL | Status: DC

## 2013-01-13 NOTE — ED Provider Notes (Signed)
Medical screening examination/treatment/procedure(s) were performed by non-physician practitioner and as supervising physician I was immediately available for consultation/collaboration.   Laray Anger, DO 01/13/13 1623

## 2013-01-13 NOTE — ED Provider Notes (Signed)
History     CSN: [id number]  Arrival date & time 01/12/13  2023   First MD Initiated Contact with Patient 01/12/13 2339      Chief Complaint  Patient presents with  . Headache    (Consider location/radiation/quality/duration/timing/severity/associated sxs/prior treatment) HPI Comments: [name] is a 47 year old male patient with a history of hypertension who presents to the emergency department complaining of a headache that has been going on all day. The headache feels like his heart is beating in his head. He states he has had headaches similar to this in the past when he had stopped taking his blood pressure medication.  He has not taken his blood pressure medication, Norvasc, in three days because he states he cant afford the pills.  He denies any associated symptoms.  He denies nausea, vomiting, photophobia, shortness of breath, fever, chills and neck pain.     Past Medical History  Diagnosis Date  . Seasonal allergies   . GERD (gastroesophageal reflux disease)   . Depression   . Hypertension   . HTN (hypertension) 07/01/2011  . Allergic rhinitis, cause unspecified 07/01/2011    History reviewed. No pertinent past surgical history.  Family History  Problem Relation Age of Onset  . Coronary artery disease Neg Hx   . Hypertension Mother   . Hypertension Father     History  Substance Use Topics  . Smoking status: Never Smoker   . Smokeless tobacco: Not on file  . Alcohol Use: No      Review of Systems  Constitutional: Negative for fever, chills, diaphoresis, activity change and fatigue.  HENT: Negative for neck pain and neck stiffness.   Respiratory: Negative for shortness of breath.   Cardiovascular: Negative for chest pain.  Gastrointestinal: Negative for nausea and vomiting.  Neurological: Positive for headaches. Negative for dizziness, syncope, speech difficulty, weakness, light-headedness and numbness.  All other systems reviewed and are  negative.    Allergies  Review of patient's allergies indicates no known allergies.  Home Medications   Current Outpatient Rx  Name  Route  Sig  Dispense  Refill  . AMLODIPINE BESYLATE 10 MG PO TABS   Oral   Take 10 mg by mouth daily.         . IBUPROFEN 200 MG PO TABS   Oral   Take 800 mg by mouth every 6 (six) hours as needed. For headache         . LISINOPRIL-HYDROCHLOROTHIAZIDE 10-12.5 MG PO TABS   Oral   Take 1 tablet by mouth daily.   30 tablet   2     BP 144/89  Pulse 70  Temp 97.9 F (36.6 C)  Resp 18  SpO2 96%  Physical Exam  Constitutional: He is oriented to person, place, and time. He appears well-developed and well-nourished. No distress.  HENT:  Head: Normocephalic and atraumatic.  Eyes: Conjunctivae normal and EOM are normal. Pupils are equal, round, and reactive to light.  Neck: Normal range of motion.  Cardiovascular: Normal rate, regular rhythm, normal heart sounds and intact distal pulses.   Pulmonary/Chest: Effort normal and breath sounds normal. No respiratory distress. He has no wheezes.  Neurological: He is alert and oriented to person, place, and time. He has normal reflexes. No cranial nerve deficit. Coordination normal.  Skin: Skin is warm and dry. He is not diaphoretic.    ED Course  Procedures (including critical care time)  Labs Reviewed  POCT I-STAT, CHEM 8 - Abnormal; Notable  for the following:    Glucose, Bld 129 (*)     All other components within normal limits   No results found.   1. Headache   2. Noncompliance with medication regimen     ED ECG REPORT   Date: 01/13/2013  EKG Time: 3:46 AM  Rate: 67  Rhythm: normal sinus rhythm,  there are no previous tracings available for comparison  Axis: noraml  Intervals:none  ST&T Change: none  Narrative Interpretation: normal            MDM  HTN, medication non compliance         Arman Filter, NP 01/13/13 [phone]

## 2013-02-08 DIAGNOSIS — T502X5A Adverse effect of carbonic-anhydrase inhibitors, benzothiadiazides and other diuretics, initial encounter: Secondary | ICD-10-CM | POA: Insufficient documentation

## 2013-02-08 DIAGNOSIS — Z8659 Personal history of other mental and behavioral disorders: Secondary | ICD-10-CM | POA: Insufficient documentation

## 2013-02-08 DIAGNOSIS — Z8709 Personal history of other diseases of the respiratory system: Secondary | ICD-10-CM | POA: Insufficient documentation

## 2013-02-08 DIAGNOSIS — Z8719 Personal history of other diseases of the digestive system: Secondary | ICD-10-CM | POA: Insufficient documentation

## 2013-02-08 DIAGNOSIS — Z79899 Other long term (current) drug therapy: Secondary | ICD-10-CM | POA: Insufficient documentation

## 2013-02-08 DIAGNOSIS — I10 Essential (primary) hypertension: Secondary | ICD-10-CM | POA: Insufficient documentation

## 2013-02-08 DIAGNOSIS — R51 Headache: Secondary | ICD-10-CM | POA: Insufficient documentation

## 2013-02-08 DIAGNOSIS — R110 Nausea: Secondary | ICD-10-CM | POA: Insufficient documentation

## 2013-02-09 ENCOUNTER — Encounter (HOSPITAL_COMMUNITY): Payer: Self-pay | Admitting: *Deleted

## 2013-02-09 ENCOUNTER — Emergency Department (HOSPITAL_COMMUNITY)
Admission: EM | Admit: 2013-02-09 | Discharge: 2013-02-09 | Disposition: A | Discharge status: Home / Self Care | Payer: Self-pay | Attending: Emergency Medicine | Admitting: Emergency Medicine

## 2013-02-09 DIAGNOSIS — T887XXA Unspecified adverse effect of drug or medicament, initial encounter: Secondary | ICD-10-CM

## 2013-02-09 DIAGNOSIS — I10 Essential (primary) hypertension: Secondary | ICD-10-CM

## 2013-02-09 MED ORDER — AMLODIPINE BESYLATE 10 MG PO TABS: 10.0000 mg | ORAL_TABLET | Freq: Every day | ORAL | Status: DC

## 2013-02-09 NOTE — ED Notes (Signed)
Pt states he was seen Feb 1st and started on lisinopril, he states pressure is high and he wants meds changed, headache and nausea

## 2013-02-09 NOTE — ED Provider Notes (Signed)
Medical screening examination/treatment/procedure(s) were performed by non-physician practitioner and as supervising physician I was immediately available for consultation/collaboration.  Taline Nass, MD 02/09/13 0658 

## 2013-02-09 NOTE — ED Provider Notes (Signed)
History     CSN: [id number]  Arrival date & time 02/08/13  2325   First MD Initiated Contact with Patient 02/09/13 0148      Chief Complaint  Patient presents with  . Hypertension   HPI  History provided by the patient. Patient is a 47 year old male with history of hypertension presents with requests for a change in his blood pressure medication. Patient states that he had been on amlodipine 10 mg for over 3 years for his blood pressure. Patient states that is controlled his symptoms quite well but more recently visited the emergency room to have a refill of his medications due to loss of insurance and was given a new prescription for lisinopril and hydrochlorothiazide. Patient states that since taking this medicine he has had headaches every day. These are improved with ibuprofen but he has been dissatisfied with the new medication. He also reports that occasionally causes him to feel nauseated. Currently he denies headache or nausea. He denies any other symptoms. Denies chest pain, shortness of breath or heart palpitations.     Past Medical History  Diagnosis Date  . Seasonal allergies   . GERD (gastroesophageal reflux disease)   . Depression   . Hypertension   . HTN (hypertension) 07/01/2011  . Allergic rhinitis, cause unspecified 07/01/2011    History reviewed. No pertinent past surgical history.  Family History  Problem Relation Age of Onset  . Coronary artery disease Neg Hx   . Hypertension Mother   . Hypertension Father     History  Substance Use Topics  . Smoking status: Never Smoker   . Smokeless tobacco: Not on file  . Alcohol Use: No      Review of Systems  All other systems reviewed and are negative.    Allergies  Review of patient's allergies indicates no known allergies.  Home Medications   Current Outpatient Rx  Name  Route  Sig  Dispense  Refill  . ibuprofen (ADVIL,MOTRIN) 200 MG tablet   Oral   Take 800 mg by mouth every 6 (six) hours as  needed. For headache         . lisinopril-hydrochlorothiazide (PRINZIDE,ZESTORETIC) 10-12.5 MG per tablet   Oral   Take 1 tablet by mouth daily.   30 tablet   2     BP 143/92  Pulse 71  Temp(Src) 98.1 F (36.7 C) (Oral)  Resp 18  SpO2 98%  Physical Exam  Nursing note and vitals reviewed. Constitutional: He is oriented to person, place, and time. He appears well-developed and well-nourished. No distress.  HENT:  Head: Normocephalic.  Eyes: Conjunctivae and EOM are normal. Pupils are equal, round, and reactive to light.  Cardiovascular: Normal rate and regular rhythm.   No murmur heard. Pulmonary/Chest: Effort normal and breath sounds normal. No respiratory distress. He has no wheezes. He has no rales.  Abdominal: Soft.  Musculoskeletal: Normal range of motion.  Neurological: He is alert and oriented to person, place, and time.  Skin: Skin is warm. He is not diaphoretic.  Psychiatric: He has a normal mood and affect. His behavior is normal.    ED Course  Procedures    1. Hypertension   2. Medication side effect       MDM  2:15 AM patient seen and evaluated. Patient currently without any complaints. He does not wish to have any testing or procedures. He wishes to only have a prescription change if possible.        Angus Seller,  PA 02/09/13 0231

## 2013-05-04 ENCOUNTER — Emergency Department (HOSPITAL_COMMUNITY)
Admission: EM | Admit: 2013-05-04 | Discharge: 2013-05-04 | Disposition: A | Discharge status: Home / Self Care | Payer: Self-pay | Attending: Emergency Medicine | Admitting: Emergency Medicine

## 2013-05-04 ENCOUNTER — Encounter (HOSPITAL_COMMUNITY): Payer: Self-pay | Admitting: Emergency Medicine

## 2013-05-04 DIAGNOSIS — J309 Allergic rhinitis, unspecified: Secondary | ICD-10-CM | POA: Insufficient documentation

## 2013-05-04 DIAGNOSIS — F3289 Other specified depressive episodes: Secondary | ICD-10-CM | POA: Insufficient documentation

## 2013-05-04 DIAGNOSIS — Z79899 Other long term (current) drug therapy: Secondary | ICD-10-CM | POA: Insufficient documentation

## 2013-05-04 DIAGNOSIS — Z91199 Patient's noncompliance with other medical treatment and regimen due to unspecified reason: Secondary | ICD-10-CM | POA: Insufficient documentation

## 2013-05-04 DIAGNOSIS — I10 Essential (primary) hypertension: Secondary | ICD-10-CM | POA: Insufficient documentation

## 2013-05-04 DIAGNOSIS — F329 Major depressive disorder, single episode, unspecified: Secondary | ICD-10-CM | POA: Insufficient documentation

## 2013-05-04 DIAGNOSIS — R110 Nausea: Secondary | ICD-10-CM | POA: Insufficient documentation

## 2013-05-04 DIAGNOSIS — R51 Headache: Secondary | ICD-10-CM

## 2013-05-04 DIAGNOSIS — K219 Gastro-esophageal reflux disease without esophagitis: Secondary | ICD-10-CM | POA: Insufficient documentation

## 2013-05-04 DIAGNOSIS — Z9119 Patient's noncompliance with other medical treatment and regimen: Secondary | ICD-10-CM | POA: Insufficient documentation

## 2013-05-04 LAB — CBC WITH DIFFERENTIAL/PLATELET
Basophils Relative: 0 % (ref 0–1)
Eosinophils Absolute: 0.5 10*3/uL (ref 0.0–0.7)
MCH: 29.9 pg (ref 26.0–34.0)
MCHC: 35.6 g/dL (ref 30.0–36.0)
Neutrophils Relative %: 55 % (ref 43–77)
Platelets: 380 10*3/uL (ref 150–400)
RDW: 13.2 % (ref 11.5–15.5)

## 2013-05-04 LAB — BASIC METABOLIC PANEL
GFR calc Af Amer: >90 mL/min (ref >90)
GFR calc non Af Amer: >90 mL/min (ref >90)
Potassium: 3.7 mEq/L (ref 3.5–5.1)
Sodium: 141 mEq/L (ref 135–145)

## 2013-05-04 MED ORDER — AMLODIPINE BESYLATE 10 MG PO TABS: 10.0000 mg | ORAL_TABLET | Freq: Every day | ORAL | Status: DC

## 2013-05-04 MED ORDER — LABETALOL HCL 5 MG/ML IV SOLN
20.0000 mg | Freq: Once | INTRAVENOUS | Status: AC
  Administered 2013-05-04: 20 mg via INTRAVENOUS
  Filled 2013-05-04: qty 4

## 2013-05-04 NOTE — ED Notes (Signed)
Pt presenting to ed with c/o hypertension out of his bp medications x 3 months due to not being able to afford medications. Pt states positive headache and positive nausea no vomiting pt denies chest pain at this time

## 2013-05-04 NOTE — ED Notes (Signed)
Per pt, has been out of BP meds for 3 months-unable to see MD for scripts due to financial reasons, has no insurance-c/o headache

## 2013-05-04 NOTE — ED Provider Notes (Signed)
History     CSN: [id number]  Arrival date & time 05/04/13  1754   First MD Initiated Contact with Patient 05/04/13 1800      Chief Complaint  Patient presents with  . Hypertension  . Headache    (Consider location/radiation/quality/duration/timing/severity/associated sxs/prior treatment) HPI Comments: Patient presents to the ER for evaluation of headache. Patient reports a throbbing posterior headache since yesterday. He reports that he has had this happen before when he is out of his blood pressure medication. He has not had similar pain for approximately 3 months. Patient denies vision change, dizziness and passing out. He has not had any chest pain, shortness of breath. Pain has gotten severe, however. She reports nausea but no vomiting associated with the symptoms.  Patient is a 47 y.o. male presenting with hypertension and headaches.  Hypertension Associated symptoms include headaches.  Headache Associated symptoms: nausea   Associated symptoms: no fever and no neck pain     Past Medical History  Diagnosis Date  . Seasonal allergies   . GERD (gastroesophageal reflux disease)   . Depression   . Hypertension   . HTN (hypertension) 07/01/2011  . Allergic rhinitis, cause unspecified 07/01/2011    History reviewed. No pertinent past surgical history.  Family History  Problem Relation Age of Onset  . Coronary artery disease Neg Hx   . Hypertension Mother   . Hypertension Father     History  Substance Use Topics  . Smoking status: Never Smoker   . Smokeless tobacco: Not on file  . Alcohol Use: No      Review of Systems  Constitutional: Negative for fever.  HENT: Negative for neck pain.   Gastrointestinal: Positive for nausea.  Neurological: Positive for headaches.  All other systems reviewed and are negative.    Allergies  Review of patient's allergies indicates no known allergies.  Home Medications   Current Outpatient Rx  Name  Route  Sig  Dispense   Refill  . amLODipine (NORVASC) 10 MG tablet   Oral   Take 1 tablet (10 mg total) by mouth daily.   30 tablet   0   . ibuprofen (ADVIL,MOTRIN) 200 MG tablet   Oral   Take 800 mg by mouth every 6 (six) hours as needed. For headache         . lisinopril-hydrochlorothiazide (PRINZIDE,ZESTORETIC) 10-12.5 MG per tablet   Oral   Take 1 tablet by mouth daily.   30 tablet   2     BP 149/136  Pulse 75  Temp(Src) 97.8 F (36.6 C) (Oral)  Resp 20  SpO2 96%  Physical Exam  Constitutional: He is oriented to person, place, and time. He appears well-developed and well-nourished. No distress.  HENT:  Head: Normocephalic and atraumatic.  Right Ear: Hearing normal.  Left Ear: Hearing normal.  Nose: Nose normal.  Mouth/Throat: Oropharynx is clear and moist and mucous membranes are normal.  Eyes: Conjunctivae and EOM are normal. Pupils are equal, round, and reactive to light.  Neck: Normal range of motion. Neck supple.  Cardiovascular: Regular rhythm, S1 normal and S2 normal.  Exam reveals no gallop and no friction rub.   No murmur heard. Pulmonary/Chest: Effort normal and breath sounds normal. No respiratory distress. He exhibits no tenderness.  Abdominal: Soft. Normal appearance and bowel sounds are normal. There is no hepatosplenomegaly. There is no tenderness. There is no rebound, no guarding, no tenderness at McBurney's point and negative Murphy's sign. No hernia.  Musculoskeletal: Normal range of  motion.  Neurological: He is alert and oriented to person, place, and time. He has normal strength. No cranial nerve deficit or sensory deficit. Coordination normal. GCS eye subscore is 4. GCS verbal subscore is 5. GCS motor subscore is 6.  Skin: Skin is warm, dry and intact. No rash noted. No cyanosis.  Psychiatric: He has a normal mood and affect. His speech is normal and behavior is normal. Thought content normal.    ED Course  Procedures (including critical care time)  Labs Reviewed   CBC WITH DIFFERENTIAL - Abnormal; Notable for the following:    WBC 11.2 (*)    All other components within normal limits  BASIC METABOLIC PANEL   No results found.   Diagnosis: Uncontrolled hypertension; headache    MDM  Patient presents with headache for 2 days that he associates with elevated blood pressure. Patient reports that in the past he has had a similar headache when he has been off of his blood pressure medication. He reports that he did lose his job last year and has run of his medications for the last 3 months. He was previously well controlled on the amlodipine. Patient has a normal neurologic examination. He was administered labetalol and his blood pressure improved to 129/80. His headache also resolved. Patient will be given a prescription for amlodipine, followup in adult care center.        Gilda Crease, MD 05/04/13 [phone]

## 2013-12-02 ENCOUNTER — Ambulatory Visit: Payer: Self-pay | Admitting: Family Medicine

## 2013-12-02 VITALS — BP 172/84 | HR 73 | Temp 98.2°F | Resp 16 | Ht 67.5 in | Wt 212.0 lb

## 2013-12-02 DIAGNOSIS — I10 Essential (primary) hypertension: Secondary | ICD-10-CM

## 2013-12-02 MED ORDER — AMLODIPINE BESYLATE 10 MG PO TABS: 10.0000 mg | ORAL_TABLET | Freq: Every day | ORAL | Status: DC

## 2013-12-02 NOTE — Patient Instructions (Signed)
Continue same medication  Return in 6 months.  Monitor BP at home.

## 2013-12-02 NOTE — Progress Notes (Signed)
Subjective: Patient has been out of blood pressure medicine for a fall. He does not have a job currently. He may have to move to [place] for a job. He feels fine. No major plates except when his temperature goes up he gets a headache and some in jaw pain.    He does not get a lot of regular exercise. He does try eat rightand they do not eat out.  Objective: Pleasant man in no acute distress. TMs normal. Throat clear. Neck supple without nodes thyromegaly. No carotid bruits. Chest is clear to auscultation. Heart regular without murmurs. Abdomen soft without mass or tenderness. No edema.  Assessment: Hypertension  Plan: Continue same medications for one year but advised him to get a recheck in about 6 months if he can afford it so.

## 2014-12-03 ENCOUNTER — Other Ambulatory Visit: Payer: Self-pay | Admitting: Family Medicine

## 2015-01-09 ENCOUNTER — Other Ambulatory Visit: Payer: Self-pay | Admitting: Family Medicine

## 2015-01-25 ENCOUNTER — Emergency Department (HOSPITAL_COMMUNITY)
Admission: EM | Admit: 2015-01-25 | Discharge: 2015-01-25 | Disposition: A | Discharge status: Home / Self Care | Payer: Self-pay | Attending: Emergency Medicine | Admitting: Emergency Medicine

## 2015-01-25 ENCOUNTER — Encounter (HOSPITAL_COMMUNITY): Payer: Self-pay | Admitting: Emergency Medicine

## 2015-01-25 ENCOUNTER — Emergency Department (HOSPITAL_COMMUNITY): Payer: Self-pay

## 2015-01-25 DIAGNOSIS — Z8659 Personal history of other mental and behavioral disorders: Secondary | ICD-10-CM | POA: Insufficient documentation

## 2015-01-25 DIAGNOSIS — Z79899 Other long term (current) drug therapy: Secondary | ICD-10-CM | POA: Insufficient documentation

## 2015-01-25 DIAGNOSIS — R079 Chest pain, unspecified: Secondary | ICD-10-CM | POA: Insufficient documentation

## 2015-01-25 DIAGNOSIS — R42 Dizziness and giddiness: Secondary | ICD-10-CM | POA: Insufficient documentation

## 2015-01-25 DIAGNOSIS — R739 Hyperglycemia, unspecified: Secondary | ICD-10-CM | POA: Insufficient documentation

## 2015-01-25 DIAGNOSIS — Z8719 Personal history of other diseases of the digestive system: Secondary | ICD-10-CM | POA: Insufficient documentation

## 2015-01-25 DIAGNOSIS — I159 Secondary hypertension, unspecified: Secondary | ICD-10-CM | POA: Insufficient documentation

## 2015-01-25 LAB — I-STAT TROPONIN, ED: Troponin i, poc: 0 ng/mL (ref 0.00–0.08)

## 2015-01-25 LAB — CBC
HCT: 45.4 % (ref 39.0–52.0)
HEMOGLOBIN: 15.5 g/dL (ref 13.0–17.0)
MCH: 29.6 pg (ref 26.0–34.0)
MCHC: 34.1 g/dL (ref 30.0–36.0)
MCV: 86.6 fL (ref 78.0–100.0)
Platelets: 391 10*3/uL (ref 150–400)
RBC: 5.24 MIL/uL (ref 4.22–5.81)
RDW: 13.8 % (ref 11.5–15.5)
WBC: 9.1 10*3/uL (ref 4.0–10.5)

## 2015-01-25 LAB — BASIC METABOLIC PANEL
Anion gap: 9 (ref 5–15)
BUN: 12 mg/dL (ref 6–23)
CO2: 25 mmol/L (ref 19–32)
Calcium: 9.1 mg/dL (ref 8.4–10.5)
Chloride: 103 mmol/L (ref 96–112)
Creatinine, Ser: 0.82 mg/dL (ref 0.50–1.35)
GFR calc Af Amer: >90 mL/min (ref >90)
GFR calc non Af Amer: >90 mL/min (ref >90)
GLUCOSE: 181 mg/dL — AB (ref 70–99)
Potassium: 3.8 mmol/L (ref 3.5–5.1)
Sodium: 137 mmol/L (ref 135–145)

## 2015-01-25 MED ORDER — AMLODIPINE BESYLATE 10 MG PO TABS: ORAL_TABLET | ORAL | Status: DC

## 2015-01-25 NOTE — ED Provider Notes (Signed)
CSN: [id number]     Arrival date & time 01/25/15  1425 History   First MD Initiated Contact with Patient 01/25/15 1520     Chief Complaint  Patient presents with  . Chest Pain  . Dizziness     (Consider location/radiation/quality/duration/timing/severity/associated sxs/prior Treatment) HPI  [name] is a 49 y.o. male who presents for dizziness, which is worse with the supine position.  The dizziness is a spinning sensation.  He also had some lightheadedness several days ago when he is standing up after working on the computer.  He knows some chest tightness, which feels like indigestion in the last several days.  He denies fever, chills, cough, shortness of breath, back pain, paresthesias or problems walking.  He complains of stress secondary to financial problems.  He does not have a primary care doctor, and needs a refill on his amlodipine.  There are no other known modifying factors.    Past Medical History  Diagnosis Date  . Seasonal allergies   . GERD (gastroesophageal reflux disease)   . Depression   . Hypertension   . HTN (hypertension) 07/01/2011  . Allergic rhinitis, cause unspecified 07/01/2011   History reviewed. No pertinent past surgical history. Family History  Problem Relation Age of Onset  . Coronary artery disease Neg Hx   . Hypertension Mother   . Hypertension Father    History  Substance Use Topics  . Smoking status: Never Smoker   . Smokeless tobacco: Not on file  . Alcohol Use: No    Review of Systems  All other systems reviewed and are negative.     Allergies  Review of patient's allergies indicates no known allergies.  Home Medications   Prior to Admission medications   Medication Sig Start Date End Date Taking? Authorizing Provider  ibuprofen (ADVIL,MOTRIN) 200 MG tablet Take 800 mg by mouth every 6 (six) hours as needed. For headache   Yes Historical Provider, MD  amLODipine (NORVASC) 10 MG tablet 1 each day for hypertension 01/25/15    Flint Melter, MD   BP 130/68 mmHg  Pulse 82  Temp(Src) 98.3 F (36.8 C) (Oral)  Resp 16  Wt 208 lb 6 oz (94.518 kg)  SpO2 98% Physical Exam  Constitutional: He is oriented to person, place, and time. He appears well-developed and well-nourished. No distress.  HENT:  Head: Normocephalic and atraumatic.  Right Ear: External ear normal.  Left Ear: External ear normal.  Eyes: Conjunctivae and EOM are normal. Pupils are equal, round, and reactive to light.  Neck: Normal range of motion and phonation normal. Neck supple.  Cardiovascular: Normal rate, regular rhythm and normal heart sounds.   Pulmonary/Chest: Effort normal and breath sounds normal. He exhibits no bony tenderness.  Abdominal: Soft. There is no tenderness.  Musculoskeletal: Normal range of motion.  Neurological: He is alert and oriented to person, place, and time. No cranial nerve deficit or sensory deficit. He exhibits normal muscle tone. Coordination normal.  No dysarthria, aphasia or nystagmus.  Skin: Skin is warm, dry and intact.  Psychiatric: He has a normal mood and affect. His behavior is normal. Judgment and thought content normal.  Nursing note and vitals reviewed.   ED Course  Procedures (including critical care time) Labs Review Labs Reviewed  BASIC METABOLIC PANEL - Abnormal; Notable for the following:    Glucose, Bld 181 (*)    All other components within normal limits  CBC  I-STAT TROPOININ, ED    Imaging Review Dg Chest 2  View  01/25/2015   CLINICAL DATA:  Chest pressure and nausea  EXAM: CHEST  2 VIEW  COMPARISON:  December 31, 2008  FINDINGS: There is no edema or consolidation. The heart size and pulmonary vascularity are normal. No adenopathy. No bone lesions.  IMPRESSION: No edema or consolidation.   Electronically Signed   By: Bretta BangWilliam  Woodruff M.D.   On: 01/25/2015 15:05     EKG Interpretation   Date/Time:  Friday January 25 2015 14:42:52 EST Ventricular Rate:  81 PR Interval:  158 QRS  Duration: 83 QT Interval:  371 QTC Calculation: 431 R Axis:   16 Text Interpretation:  Sinus rhythm since last tracing no significant  change Confirmed by Dorma Altman  MD, Joshus Rogan ([id number] on 01/25/2015 3:21:07 PM      MDM   Final diagnoses:  Dizziness  Hyperglycemia  Secondary hypertension, unspecified    Nonspecific dizziness, with reassuring evaluation.  Doubt CVA, metabolic instability, particularly urgency or seizures bacterial infection.   Nursing Notes Reviewed/ Care Coordinated Applicable Imaging Reviewed Interpretation of Laboratory Data incorporated into ED treatment  The patient appears reasonably screened and/or stabilized for discharge and I doubt any other medical condition or other [facility] requiring further screening, evaluation, or treatment in the ED at this time prior to discharge.  Plan: Home Medications- Rx Norvasc; Home Treatments- rest; return here if the recommended treatment, does not improve the symptoms; Recommended follow up- PCP asap   Flint MelterElliott L Karlita Lichtman, MD 01/25/15 [phone]

## 2015-01-25 NOTE — ED Notes (Signed)
Pt c/o chest pressure, dizziness, nausea, onset last night.

## 2015-01-25 NOTE — Discharge Instructions (Signed)
Dizziness °Dizziness is a common problem. It is a feeling of unsteadiness or light-headedness. You may feel like you are about to faint. Dizziness can lead to injury if you stumble or fall. A person of any age group can suffer from dizziness, but dizziness is more common in older adults. °CAUSES  °Dizziness can be caused by many different things, including: °· Middle ear problems. °· Standing for too long. °· Infections. °· An allergic reaction. °· Aging. °· An emotional response to something, such as the sight of blood. °· Side effects of medicines. °· Tiredness. °· Problems with circulation or blood pressure. °· Excessive use of alcohol or medicines, or illegal drug use. °· Breathing too fast (hyperventilation). °· An irregular heart rhythm (arrhythmia). °· A low red blood cell count (anemia). °· Pregnancy. °· Vomiting, diarrhea, fever, or other illnesses that cause body fluid loss (dehydration). °· Diseases or conditions such as Parkinson's disease, high blood pressure (hypertension), diabetes, and thyroid problems. °· Exposure to extreme heat. °DIAGNOSIS  °Your health care provider will ask about your symptoms, perform a physical exam, and perform an electrocardiogram (ECG) to record the electrical activity of your heart. Your health care provider may also perform other heart or blood tests to determine the cause of your dizziness. These may include: °· Transthoracic echocardiogram (TTE). During echocardiography, sound waves are used to evaluate how blood flows through your heart. °· Transesophageal echocardiogram (TEE). °· Cardiac monitoring. This allows your health care provider to monitor your heart rate and rhythm in real time. °· Holter monitor. This is a portable device that records your heartbeat and can help diagnose heart arrhythmias. It allows your health care provider to track your heart activity for several days if needed. °· Stress tests by exercise or by giving medicine that makes the heart beat  faster. °TREATMENT  °Treatment of dizziness depends on the cause of your symptoms and can vary greatly. °HOME CARE INSTRUCTIONS  °· Drink enough fluids to keep your urine clear or pale yellow. This is especially important in very hot weather. In older adults, it is also important in cold weather. °· Take your medicine exactly as directed if your dizziness is caused by medicines. When taking blood pressure medicines, it is especially important to get up slowly. °· Rise slowly from chairs and steady yourself until you feel okay. °· In the morning, first sit up on the side of the bed. When you feel okay, stand slowly while holding onto something until you know your balance is fine. °· Move your legs often if you need to stand in one place for a long time. Tighten and relax your muscles in your legs while standing. °· Have someone stay with you for 1-2 days if dizziness continues to be a problem. Do this until you feel you are well enough to stay alone. Have the person call your health care provider if he or she notices changes in you that are concerning. °· Do not drive or use heavy machinery if you feel dizzy. °· Do not drink alcohol. °SEEK IMMEDIATE MEDICAL CARE IF:  °· Your dizziness or light-headedness gets worse. °· You feel nauseous or vomit. °· You have problems talking, walking, or using your arms, hands, or legs. °· You feel weak. °· You are not thinking clearly or you have trouble forming sentences. It may take a friend or family member to notice this. °· You have chest pain, abdominal pain, shortness of breath, or sweating. °· Your vision changes. °· You notice   any bleeding. °· You have side effects from medicine that seems to be getting worse rather than better. °MAKE SURE YOU:  °· Understand these instructions. °· Will watch your condition. °· Will get help right away if you are not doing well or get worse. °Document Released: 06/02/2001 Document Revised: 12/12/2013 Document Reviewed: 06/26/2011 °ExitCare®  Patient Information ©2015 [employer]. This information is not intended to replace advice given to you by your health care provider. Make sure you discuss any questions you have with your health care provider. ° °Hypertension °Hypertension, commonly called high blood pressure, is when the force of blood pumping through your arteries is too strong. Your arteries are the blood vessels that carry blood from your heart throughout your body. A blood pressure reading consists of a higher number over a lower number, such as 110/72. The higher number (systolic) is the pressure inside your arteries when your heart pumps. The lower number (diastolic) is the pressure inside your arteries when your heart relaxes. Ideally you want your blood pressure below 120/80. °Hypertension forces your heart to work harder to pump blood. Your arteries may become narrow or stiff. Having hypertension puts you at risk for heart disease, stroke, and other problems.  °RISK FACTORS °Some risk factors for high blood pressure are controllable. Others are not.  °Risk factors you cannot control include:  °· Race. You may be at higher risk if you are African American. °· Age. Risk increases with age. °· Gender. Men are at higher risk than women before age 45 years. After age 65, women are at higher risk than men. °Risk factors you can control include: °· Not getting enough exercise or physical activity. °· Being overweight. °· Getting too much fat, sugar, calories, or salt in your diet. °· Drinking too much alcohol. °SIGNS AND SYMPTOMS °Hypertension does not usually cause signs or symptoms. Extremely high blood pressure (hypertensive crisis) may cause headache, anxiety, shortness of breath, and nosebleed. °DIAGNOSIS  °To check if you have hypertension, your health care provider will measure your blood pressure while you are seated, with your arm held at the level of your heart. It should be measured at least twice using the same arm. Certain  conditions can cause a difference in blood pressure between your right and left arms. A blood pressure reading that is higher than normal on one occasion does not mean that you need treatment. If one blood pressure reading is high, ask your health care provider about having it checked again. °TREATMENT  °Treating high blood pressure includes making lifestyle changes and possibly taking medicine. Living a healthy lifestyle can help lower high blood pressure. You may need to change some of your habits. °Lifestyle changes may include: °· Following the DASH diet. This diet is high in fruits, vegetables, and whole grains. It is low in salt, red meat, and added sugars. °· Getting at least 2½ hours of brisk physical activity every week. °· Losing weight if necessary. °· Not smoking. °· Limiting alcoholic beverages. °· Learning ways to reduce stress. ° If lifestyle changes are not enough to get your blood pressure under control, your health care provider may prescribe medicine. You may need to take more than one. Work closely with your health care provider to understand the risks and benefits. °HOME CARE INSTRUCTIONS °· Have your blood pressure rechecked as directed by your health care provider.   °· Take medicines only as directed by your health care provider. Follow the directions carefully. Blood pressure medicines must be taken as   prescribed. The medicine does not work as well when you skip doses. Skipping doses also puts you at risk for problems.   Do not smoke.   Monitor your blood pressure at home as directed by your health care provider. SEEK MEDICAL CARE IF:   You think you are having a reaction to medicines taken.  You have recurrent headaches or feel dizzy.  You have swelling in your ankles.  You have trouble with your vision. SEEK IMMEDIATE MEDICAL CARE IF:  You develop a severe headache or confusion.  You have unusual weakness, numbness, or feel faint.  You have severe chest or abdominal  pain.  You vomit repeatedly.  You have trouble breathing. Hyperglycemia Hyperglycemia occurs when the glucose (sugar) in your blood is too high. Hyperglycemia can happen for many reasons, but it most often happens to people who do not know they have diabetes or are not managing their diabetes properly.  CAUSES  Whether you have diabetes or not, there are other causes of hyperglycemia. Hyperglycemia can occur when you have diabetes, but it can also occur in other situations that you might not be as aware of, such as: Diabetes If you have diabetes and are having problems controlling your blood glucose, hyperglycemia could occur because of some of the following reasons: Not following your meal plan. Not taking your diabetes medications or not taking it properly. Exercising less or doing less activity than you normally do. Being sick. Pre-diabetes This cannot be ignored. Before people develop Type 2 diabetes, they almost always have "pre-diabetes." This is when your blood glucose levels are higher than normal, but not yet high enough to be diagnosed as diabetes. Research has shown that some long-term damage to the body, especially the heart and circulatory system, may already be occurring during pre-diabetes. If you take action to manage your blood glucose when you have pre-diabetes, you may delay or prevent Type 2 diabetes from developing. Stress If you have diabetes, you may be "diet" controlled or on oral medications or insulin to control your diabetes. However, you may find that your blood glucose is higher than usual in the hospital whether you have diabetes or not. This is often referred to as "stress hyperglycemia." Stress can elevate your blood glucose. This happens because of hormones put out by the body during times of stress. If stress has been the cause of your high blood glucose, it can be followed regularly by your caregiver. That way he/she can make sure your hyperglycemia does not  continue to get worse or progress to diabetes. Steroids Steroids are medications that act on the infection fighting system (immune system) to block inflammation or infection. One side effect can be a rise in blood glucose. Most people can produce enough extra insulin to allow for this rise, but for those who cannot, steroids make blood glucose levels go even higher. It is not unusual for steroid treatments to "uncover" diabetes that is developing. It is not always possible to determine if the hyperglycemia will go away after the steroids are stopped. A special blood test called an A1c is sometimes done to determine if your blood glucose was elevated before the steroids were started. SYMPTOMS Thirsty. Frequent urination. Dry mouth. Blurred vision. Tired or fatigue. Weakness. Sleepy. Tingling in feet or leg. DIAGNOSIS  Diagnosis is made by monitoring blood glucose in one or all of the following ways: A1c test. This is a chemical found in your blood. Fingerstick blood glucose monitoring. Laboratory results. TREATMENT  First, knowing  the cause of the hyperglycemia is important before the hyperglycemia can be treated. Treatment may include, but is not be limited to: Education. Change or adjustment in medications. Change or adjustment in meal plan. Treatment for an illness, infection, etc. More frequent blood glucose monitoring. Change in exercise plan. Decreasing or stopping steroids. Lifestyle changes. HOME CARE INSTRUCTIONS  Test your blood glucose as directed. Exercise regularly. Your caregiver will give you instructions about exercise. Pre-diabetes or diabetes which comes on with stress is helped by exercising. Eat wholesome, balanced meals. Eat often and at regular, fixed times. Your caregiver or nutritionist will give you a meal plan to guide your sugar intake. Being at an ideal weight is important. If needed, losing as little as 10 to 15 pounds may help improve blood glucose  levels. SEEK MEDICAL CARE IF:  You have questions about medicine, activity, or diet. You continue to have symptoms (problems such as increased thirst, urination, or weight gain). SEEK IMMEDIATE MEDICAL CARE IF:  You are vomiting or have diarrhea. Your breath smells fruity. You are breathing faster or slower. You are very sleepy or incoherent. You have numbness, tingling, or pain in your feet or hands. You have chest pain. Your symptoms get worse even though you have been following your caregiver's orders. If you have any other questions or concerns. Document Released: 06/02/2001 Document Revised: 02/29/2012 Document Reviewed: 04/04/2012 [facility] Patient Information 2015 [place], [place]. This information is not intended to replace advice given to you by your health care provider. Make sure you discuss any questions you have with your health care provider.    Emergency Department Resource Guide 1) Find a Doctor and Pay Out of Pocket Although you won't have to find out who is covered by your insurance plan, it is a good idea to ask around and get recommendations. You will then need to call the office and see if the doctor you have chosen will accept you as a new patient and what types of options they offer for patients who are self-pay. Some doctors offer discounts or will set up payment plans for their patients who do not have insurance, but you will need to ask so you aren't surprised when you get to your appointment.  2) Contact Your Local Health Department Not all health departments have doctors that can see patients for sick visits, but many do, so it is worth a call to see if yours does. If you don't know where your local health department is, you can check in your phone book. The CDC also has a tool to help you locate your state's health department, and many state websites also have listings of all of their local health departments.  3) Find a Walk-in Clinic If your illness is not  likely to be very severe or complicated, you may want to try a walk in clinic. These are popping up all over the country in pharmacies, drugstores, and shopping centers. They're usually staffed by nurse practitioners or physician assistants that have been trained to treat common illnesses and complaints. They're usually fairly quick and inexpensive. However, if you have serious medical issues or chronic medical problems, these are probably not your best option.  No Primary Care Doctor: - Call Health Connect at  [phone] - they can help you locate a primary care doctor that  accepts your insurance, provides certain services, etc. - Physician Referral Service- [phone]  Chronic Pain Problems: Organization         Address  Phone  Notes  [facility]  [phone] Patients need to be referred by their primary care doctor.   Medication Assistance: Organization         Address  Phone   Notes  [facility]ication [facility] [address] [place], [place] [address] [phone] --Must be a resident of [place] [facility] [place] -- Must have NO insurance coverage whatsoever (no Medicaid/ Medicare, etc.) -- The pt. MUST have a primary care doctor that directs their care regularly and follows them in the community   Med[facility]ist  [phone]   [employer]  [phone]    Agencies that provide inexpensive medical care: Organization         Address  Phone   Notes  [facility]  [phone]   [facility]    [phone]   [facility] [address] [place], [place] [address] [phone]   [facility] [address], [place] [phone]   Planned Parenthood    [phone]   [facility]    [phone]   [facility]  [address], [place] Phone:  [phone], Fax:  [phone] Hours of Operation:  9 am - 6 pm,  M-F.  Also accepts Medicaid/Medicare and self-pay.  [facility]  [address], [place] Phone: [phone], Fax: [phone]. Hours of Operation:  8:30 am - 5:30 pm, M-F.  Also accepts Medicaid and self-pay.  [facility] [place] [address], [place] Phone: [phone]   [facility] [address], [place], [place] [phone], Ext. [phone] Mondays & Thursdays: 7-9 AM.  First 15 patients are seen on a first come, first serve basis.    Medicaid-accepting [facility] Providers:  Organization         Address  Phone   Notes  [facility] [address], [place] [phone] Also accepts self-pay patients.  [facility] [address], [place]  [phone]   [facility] [address], [place] [phone]   [facility] [address] [place] [address], [place] [phone]   Renaye RakersVeita Bland [address], [place]   [phone] Only accepts [place] Access [place] patients after they have their name applied to their card.   Self-Pay (no insurance) in [facility] [place]:  Organization         Address  Phone   Notes  Sickle Cell Patients, [facility] [address], [place] [phone]   [facility] [address], [place] [phone]   [facility] [place]  [address], [place] [phone]   [facility]/Dr. Osei-Bonsu  [address] [place] [address], [place] or [id number], [place] [phone] Phone number for both [place] and [place] locations is the same.  [facility] [address], [place] [phone]   [facility] [place] [address] [place] [address], [place] or [address] [phone]   [facility] [address], [place] [phone], phone; [phone], fax Sees patients 1st and 3rd Saturday of every month.  Must not qualify for public or private insurance (  i.e. Medicaid, Medicare, [place] Health Choice, Veterans' Benefits)  Household income should be no more than 200% of the poverty level The clinic cannot treat you if you are pregnant or think you are pregnant  Sexually transmitted diseases are not treated at the clinic.    Dental Care: Organization         Address  Phone  Notes  [facility] [address] [place], [place] [phone] Accepts children up to age 59 who are enrolled in [place] or [place] Health Choice; pregnant women with a Medicaid card; and children who have applied for Medicaid or [place] Health Choice, but were declined, whose parents can pay a reduced fee at time of service.  [facility] Department of [facility]  [address], [place] [phone] Accepts children up to age 59 who are enrolled in [place] or [place] Health Choice; pregnant women with a Medicaid card; and children who have applied for Medicaid or [place] Health Choice, but were declined, whose parents can pay a reduced fee at time of service.  [facility]  [address] [place], [place] [phone] Patients are seen by appointment only. Walk-ins are not accepted. [facility] will see patients 80 years of age and older. Monday - Tuesday (8am-5pm) Most Wednesdays (8:30-5pm) $30 per visit, cash only  [facility] Access [facility]  [address], [facility] [phone] Patients are seen by appointment only. Walk-ins are not accepted. [facility] will see patients 52 years of age and older. One Wednesday Evening (Monthly: Volunteer Based).  $30 per visit, cash only  [employer]  [phone] for adults; Children under age 30, call Graduate Pediatric Dentistry at [phone]. Children aged 50-14, please call [phone] to request a pediatric application.  Dental services are provided in all areas of dental care including fillings, crowns and bridges, complete and partial dentures, implants, gum treatment, root canals, and extractions. Preventive care is also provided. Treatment is provided to both adults and children. Patients are selected via a lottery and there is often a waiting list.   [facility] [address], [place]  [phone] [email or web address]   [facility] [address] [place], [place] [phone], Ext. [phone] Second and Fourth Thursday of each month, opens at 6:30 AM; Clinic ends at 9 AM.  Patients are seen on a first-come first-served basis, and a limited number are seen during each clinic.   [facility]  [address] [place], [place] [phone]   Eligibility Requirements You must have lived in [place], [place], or [place] counties for at least the last three months.   You cannot be eligible for state or federal sponsored [place], including [employer], [place], or [employer].   You generally cannot be eligible for healthcare insurance through your employer.    How to apply: Eligibility screenings are held every Tuesday and Wednesday afternoon from 1:00 pm until 4:00 pm. You do not need an appointment for the interview!  [facility] [address] [place], [place] [phone]   [facility] Health Department  [phone]   [facility] Health Department  [phone]   [facility] Health Department  [phone]    Behavioral Health Resources in the Community: Intensive Outpatient Programs Organization         Address  Phone  Notes  [facility] [address] [place], [place] [phone]   [facility] Outpatient [address] [place], [place] [phone]   [facility]  [address] [place], [place]  [phone]   [facility] [address]  [place], [place] [phone] or [phone]   Substance Abuse Resources Organization         Address  Phone  Notes  [facility]  [phone]   [facility]  [phone]   [facility] [place]  [phone]   Floydene Flock  [phone]   Residential & Outpatient Substance Abuse Program  [phone]   Psychological Services Organization         Address  Phone  Notes  [facility] [facility]  [phone]   [facility]  [phone]   [facility] [address] [place] [phone] or [phone]    Mobile Crisis Teams Organization         Address  Phone  Notes  [facility], Mobile Crisis Care Unit  [phone]   [facility]  [address] [place], [place] [phone]   Doristine Locks [address] [place] [phone]    Self-Help/Support Groups Organization         Address  Phone             Notes  [facility] - variety of support groups  [phone]- [phone] Call for more information  Narcotics Anonymous (NA), [facility] [address], [employer] [place]  2 meetings at this location   Statistician         Address  Phone  Notes  [facility] [address],    [place]  [phone]   [facility]  [address], [place] [address], [place], [place] [phone]   [facility] [address] [place], [place] [phone] Admissions: 8am-3pm M-F  [facility] [address],    [place], [place] [phone]   [facility] [address] [place], [place], [place] [phone]   [facility] [address],  [place], [place] [phone]   [facility] [address], [place], [place]  [phone]   [facility] ([facility] [address] [place]  [place], [place] [phone] or [phone]   [facility] [address] [place], [place] [phone] Accepts Medicaid  [facility] [address]  [place] [phone] Substance Abuse/Addiction Treatment   [facility] Organization         Address  Phone  Notes  [facility]  [phone]   Angie Fava, PhD [address] Knack [place], [place]   [phone] or [phone]   [facility]   [address] [place], [place] [phone]   [facility] [address], [place], [place] [phone] Insurance/Medicaid/sponsorship through [facility] [address]                                    [place], [place] [phone] Therapy/tele-psych/case  [facility] [address], [place] [phone]    Dr. Lolly Mustache  [phone]   [facility]  [employer] [facility] Dept. 1)  [address] [place] 2) [address] [place] 3)  [address], [place] [phone]  [phone]   [facility] Child Abuse Hotline (719)613-5327 or [phone] (After Hours)      MAKE SURE YOU:   Understand these instructions.  Will watch your condition.  Will get help right away if you are not doing well or get worse. Document Released: 12/07/2005 Document Revised: 04/23/2014 Document Reviewed: 09/29/2013 [facility] Patient Information 2015 [place], [place]. This information is not intended to replace advice given to you by your health care provider. Make sure you discuss any questions you have with your health care provider.

## 2015-03-08 ENCOUNTER — Emergency Department (HOSPITAL_COMMUNITY): Payer: Self-pay

## 2015-03-08 ENCOUNTER — Emergency Department (HOSPITAL_COMMUNITY)
Admission: EM | Admit: 2015-03-08 | Discharge: 2015-03-08 | Disposition: A | Discharge status: Home / Self Care | Payer: Self-pay | Attending: Emergency Medicine | Admitting: Emergency Medicine

## 2015-03-08 ENCOUNTER — Encounter (HOSPITAL_COMMUNITY): Payer: Self-pay | Admitting: Emergency Medicine

## 2015-03-08 DIAGNOSIS — J069 Acute upper respiratory infection, unspecified: Secondary | ICD-10-CM | POA: Insufficient documentation

## 2015-03-08 DIAGNOSIS — M940 Chondrocostal junction syndrome [Tietze]: Secondary | ICD-10-CM | POA: Insufficient documentation

## 2015-03-08 DIAGNOSIS — I10 Essential (primary) hypertension: Secondary | ICD-10-CM | POA: Insufficient documentation

## 2015-03-08 DIAGNOSIS — R05 Cough: Secondary | ICD-10-CM

## 2015-03-08 DIAGNOSIS — J45901 Unspecified asthma with (acute) exacerbation: Secondary | ICD-10-CM | POA: Insufficient documentation

## 2015-03-08 DIAGNOSIS — Z79899 Other long term (current) drug therapy: Secondary | ICD-10-CM | POA: Insufficient documentation

## 2015-03-08 DIAGNOSIS — Z8719 Personal history of other diseases of the digestive system: Secondary | ICD-10-CM | POA: Insufficient documentation

## 2015-03-08 DIAGNOSIS — Z8659 Personal history of other mental and behavioral disorders: Secondary | ICD-10-CM | POA: Insufficient documentation

## 2015-03-08 DIAGNOSIS — R059 Cough, unspecified: Secondary | ICD-10-CM

## 2015-03-08 MED ORDER — BENZONATATE 200 MG PO CAPS: 200.0000 mg | ORAL_CAPSULE | Freq: Three times a day (TID) | ORAL | Status: DC | PRN

## 2015-03-08 MED ORDER — ALBUTEROL SULFATE HFA 108 (90 BASE) MCG/ACT IN AERS: 2.0000 | INHALATION_SPRAY | RESPIRATORY_TRACT | Status: DC | PRN

## 2015-03-08 MED ORDER — ALBUTEROL SULFATE HFA 108 (90 BASE) MCG/ACT IN AERS
2.0000 | INHALATION_SPRAY | Freq: Once | RESPIRATORY_TRACT | Status: AC
  Administered 2015-03-08: 2 via RESPIRATORY_TRACT
  Filled 2015-03-08: qty 6.7

## 2015-03-08 MED ORDER — PREDNISONE 20 MG PO TABS
60.0000 mg | ORAL_TABLET | Freq: Once | ORAL | Status: AC
  Administered 2015-03-08: 60 mg via ORAL
  Filled 2015-03-08: qty 3

## 2015-03-08 MED ORDER — FLUTICASONE PROPIONATE 50 MCG/ACT NA SUSP: 2.0000 | Freq: Every day | NASAL | Status: DC

## 2015-03-08 MED ORDER — NAPROXEN 500 MG PO TABS
500.0000 mg | ORAL_TABLET | Freq: Once | ORAL | Status: AC
  Administered 2015-03-08: 500 mg via ORAL
  Filled 2015-03-08: qty 1

## 2015-03-08 MED ORDER — NAPROXEN 500 MG PO TABS: 500.0000 mg | ORAL_TABLET | Freq: Two times a day (BID) | ORAL | Status: DC | PRN

## 2015-03-08 MED ORDER — PREDNISONE 20 MG PO TABS: ORAL_TABLET | ORAL | Status: DC

## 2015-03-08 NOTE — Discharge Instructions (Signed)
Continue to stay well-hydrated. Gargle warm salt water and spit it out. Continue to alternate between Tylenol and naprosyn for pain or fever. Use Mucinex for cough suppression/expectoration of mucus. Use netipot and flonase to help with nasal congestion. May consider over-the-counter Benadryl or other antihistamine to decrease secretions and for watery itchy eyes. Followup with your primary care doctor in 5-7 days for recheck of ongoing symptoms. Return to emergency department for emergent changing or worsening of symptoms. Use inhaler as directed, as needed for cough/chest congestion. Use Tesslon pearls for cough suppression.    Upper Respiratory Infection, Adult An upper respiratory infection (URI) is also known as the common cold. It is often caused by a type of germ (virus). Colds are easily spread (contagious). You can pass it to others by kissing, coughing, sneezing, or drinking out of the same glass. Usually, you get better in 1 or 2 weeks.  HOME CARE   Only take medicine as told by your doctor.  Use a warm mist humidifier or breathe in steam from a hot shower.  Drink enough water and fluids to keep your pee (urine) clear or pale yellow.  Get plenty of rest.  Return to work when your temperature is back to normal or as told by your doctor. You may use a face mask and wash your hands to stop your cold from spreading. GET HELP RIGHT AWAY IF:   After the first few days, you feel you are getting worse.  You have questions about your medicine.  You have chills, shortness of breath, or brown or red spit (mucus).  You have yellow or brown snot (nasal discharge) or pain in the face, especially when you bend forward.  You have a fever, puffy (swollen) neck, pain when you swallow, or white spots in the back of your throat.  You have a bad headache, ear pain, sinus pain, or chest pain.  You have a high-pitched whistling sound when you breathe in and out (wheezing).  You have a lasting  cough or cough up blood.  You have sore muscles or a stiff neck. MAKE SURE YOU:   Understand these instructions.  Will watch your condition.  Will get help right away if you are not doing well or get worse. Document Released: 05/25/2008 Document Revised: 02/29/2012 Document Reviewed: 03/14/2014 [facility] Patient Information 2015 [place], [place]. This information is not intended to replace advice given to you by your health care provider. Make sure you discuss any questions you have with your health care provider.  Asthma Asthma is a recurring condition in which the airways tighten and narrow. Asthma can make it difficult to breathe. It can cause coughing, wheezing, and shortness of breath. Asthma episodes, also called asthma attacks, range from minor to life-threatening. Asthma cannot be cured, but medicines and lifestyle changes can help control it. CAUSES Asthma is believed to be caused by inherited (genetic) and environmental factors, but its exact cause is unknown. Asthma may be triggered by allergens, lung infections, or irritants in the air. Asthma triggers are different for each person. Common triggers include:   Animal dander.  Dust mites.  Cockroaches.  Pollen from trees or grass.  Mold.  Smoke.  Air pollutants such as dust, household cleaners, hair sprays, aerosol sprays, paint fumes, strong chemicals, or strong odors.  Cold air, weather changes, and winds (which increase molds and pollens in the air).  Strong emotional expressions such as crying or laughing hard.  Stress.  Certain medicines (such as aspirin) or types  of drugs (such as beta-blockers).  Sulfites in foods and drinks. Foods and drinks that may contain sulfites include dried fruit, potato chips, and sparkling grape juice.  Infections or inflammatory conditions such as the flu, a cold, or an inflammation of the nasal membranes (rhinitis).  Gastroesophageal reflux disease (GERD).  Exercise or  strenuous activity. SYMPTOMS Symptoms may occur immediately after asthma is triggered or many hours later. Symptoms include:  Wheezing.  Excessive nighttime or early morning coughing.  Frequent or severe coughing with a common cold.  Chest tightness.  Shortness of breath. DIAGNOSIS  The diagnosis of asthma is made by a review of your medical history and a physical exam. Tests may also be performed. These may include:  Lung function studies. These tests show how much air you breathe in and out.  Allergy tests.  Imaging tests such as X-rays. TREATMENT  Asthma cannot be cured, but it can usually be controlled. Treatment involves identifying and avoiding your asthma triggers. It also involves medicines. There are 2 classes of medicine used for asthma treatment:   Controller medicines. These prevent asthma symptoms from occurring. They are usually taken every day.  Reliever or rescue medicines. These quickly relieve asthma symptoms. They are used as needed and provide short-term relief. Your health care provider will help you create an asthma action plan. An asthma action plan is a written plan for managing and treating your asthma attacks. It includes a list of your asthma triggers and how they may be avoided. It also includes information on when medicines should be taken and when their dosage should be changed. An action plan may also involve the use of a device called a peak flow meter. A peak flow meter measures how well the lungs are working. It helps you monitor your condition. HOME CARE INSTRUCTIONS   Take medicines only as directed by your health care provider. Speak with your health care provider if you have questions about how or when to take the medicines.  Use a peak flow meter as directed by your health care provider. Record and keep track of readings.  Understand and use the action plan to help minimize or stop an asthma attack without needing to seek medical  care.  Control your home environment in the following ways to help prevent asthma attacks:  Do not smoke. Avoid being exposed to secondhand smoke.  Change your heating and air conditioning filter regularly.  Limit your use of fireplaces and wood stoves.  Get rid of pests (such as roaches and mice) and their droppings.  Throw away plants if you see mold on them.  Clean your floors and dust regularly. Use unscented cleaning products.  Try to have someone else vacuum for you regularly. Stay out of rooms while they are being vacuumed and for a short while afterward. If you vacuum, use a dust mask from a hardware store, a double-layered or microfilter vacuum cleaner bag, or a vacuum cleaner with a HEPA filter.  Replace carpet with wood, tile, or vinyl flooring. Carpet can trap dander and dust.  Use allergy-proof pillows, mattress covers, and box spring covers.  Wash bed sheets and blankets every week in hot water and dry them in a dryer.  Use blankets that are made of polyester or cotton.  Clean bathrooms and kitchens with bleach. If possible, have someone repaint the walls in these rooms with mold-resistant paint. Keep out of the rooms that are being cleaned and painted.  Wash hands frequently. SEEK MEDICAL CARE IF:  You have wheezing, shortness of breath, or a cough even if taking medicine to prevent attacks.  The colored mucus you cough up (sputum) is thicker than usual.  Your sputum changes from clear or white to yellow, green, gray, or bloody.  You have any problems that may be related to the medicines you are taking (such as a rash, itching, swelling, or trouble breathing).  You are using a reliever medicine more than 2-3 times per week.  Your peak flow is still at 50-79% of your personal best after following your action plan for 1 hour.  You have a fever. SEEK IMMEDIATE MEDICAL CARE IF:   You seem to be getting worse and are unresponsive to treatment during an asthma  attack.  You are short of breath even at rest.  You get short of breath when doing very little physical activity.  You have difficulty eating, drinking, or talking due to asthma symptoms.  You develop chest pain.  You develop a fast heartbeat.  You have a bluish color to your lips or fingernails.  You are light-headed, dizzy, or faint.  Your peak flow is less than 50% of your personal best. MAKE SURE YOU:   Understand these instructions.  Will watch your condition.  Will get help right away if you are not doing well or get worse. Document Released: 12/07/2005 Document Revised: 04/23/2014 Document Reviewed: 07/06/2013 [facility] Patient Information 2015 [place], [place]. This information is not intended to replace advice given to you by your health care provider. Make sure you discuss any questions you have with your health care provider.  Chest Wall Pain Chest wall pain is pain felt in or around the chest bones and muscles. It may take up to 6 weeks to get better. It may take longer if you are active. Chest wall pain can happen on its own. Other times, things like germs, injury, coughing, or exercise can cause the pain. HOME CARE   Avoid activities that make you tired or cause pain. Try not to use your chest, belly (abdominal), or side muscles. Do not use heavy weights.  Put ice on the sore area.  Put ice in a plastic bag.  Place a towel between your skin and the bag.  Leave the ice on for 15-20 minutes for the first 2 days.  Only take medicine as told by your doctor. GET HELP RIGHT AWAY IF:   You have more pain or are very uncomfortable.  You have a fever.  Your chest pain gets worse.  You have new problems.  You feel sick to your stomach (nauseous) or throw up (vomit).  You start to sweat or feel lightheaded.  You have a cough with mucus (phlegm).  You cough up blood. MAKE SURE YOU:   Understand these instructions.  Will watch your condition.  Will get  help right away if you are not doing well or get worse. Document Released: 05/25/2008 Document Revised: 02/29/2012 Document Reviewed: 08/03/2011 [facility] Patient Information 2015 [place], [place]. This information is not intended to replace advice given to you by your health care provider. Make sure you discuss any questions you have with your health care provider.  Cough, Adult  A cough is a reflex. It helps you clear your throat and airways. A cough can help heal your body. A cough can last 2 or 3 weeks (acute) or may last more than 8 weeks (chronic). Some common causes of a cough can include an infection, allergy, or a cold. HOME CARE  Only take medicine as told by your doctor.  If given, take your medicines (antibiotics) as told. Finish them even if you start to feel better.  Use a cold steam vaporizer or humidifier in your home. This can help loosen thick spit (secretions).  Sleep so you are almost sitting up (semi-upright). Use pillows to do this. This helps reduce coughing.  Rest as needed.  Stop smoking if you smoke. GET HELP RIGHT AWAY IF:  You have yellowish-white fluid (pus) in your thick spit.  Your cough gets worse.  Your medicine does not reduce coughing, and you are losing sleep.  You cough up blood.  You have trouble breathing.  Your pain gets worse and medicine does not help.  You have a fever. MAKE SURE YOU:   Understand these instructions.  Will watch your condition.  Will get help right away if you are not doing well or get worse. Document Released: 08/20/2011 Document Revised: 04/23/2014 Document Reviewed: 08/20/2011 [facility] Patient Information 2015 [place], [place]. This information is not intended to replace advice given to you by your health care provider. Make sure you discuss any questions you have with your health care provider.

## 2015-03-08 NOTE — ED Provider Notes (Signed)
CSN: [id number]     Arrival date & time 03/08/15  1437 History  This chart was scribed for Keith Strauss, PA-C, working with Eber Hong, MD by Chestine Spore, ED Scribe. The patient was seen in room WTR8/WTR8 at 3:20 PM.    Chief Complaint  Patient presents with  . Cough    mucous production  . Chest Pain    tightness     Patient is a 49 y.o. male presenting with cough and chest pain. The history is provided by the patient. No language interpreter was used.  Cough Cough characteristics:  Productive Sputum characteristics:  Green Severity:  Moderate Onset quality:  Sudden Duration:  6 days Timing:  Constant Progression:  Unchanged Chronicity:  New Smoker: no   Context: upper respiratory infection   Context: not occupational exposure, not sick contacts and not smoke exposure   Relieved by:  Nothing Worsened by:  Environmental changes and lying down Ineffective treatments:  Decongestant and cough suppressants Associated symptoms: chills, headaches, rhinorrhea and wheezing   Associated symptoms: no chest pain, no ear fullness, no ear pain, no eye discharge, no fever, no rash, no shortness of breath, no sinus congestion and no sore throat   Chest Pain Associated symptoms: cough, fatigue and headache   Associated symptoms: no abdominal pain, no dizziness, no dysphagia, no fever, no nausea, no numbness, no shortness of breath, not vomiting and no weakness     HPI Comments: [name] is a 49 y.o. male with a medical hx of seasonal allergies, HTN, and allergy induced asthma, who presents to the Emergency Department complaining of cough onset 6 days worsening last night. Pt couldn't sleep because when he sleeps the cough is worse. The cough is productive of green sputum without bloody sputum. Pt yesterday heard whistling as he was breathing last night and he has had this happened before when he's has allergies. He states that he is having associated symptoms of chest tightness  while coughing, mild HA, green rhinorrhea, and wheezing. He states that he has tried tylenol severe cold and flu, mucinex, allegra-d, robitussion with no relief for his symptoms. He denies fevers, CP, SOB, ear pain, ear drainage, abdominal pain, n/v/d, constipation, dysuria, hematuria, sore throat, eye pain, eye drainage, dizziness, lightheadedness, numbness, tingling, weakness, joint swelling, arthralgia, and any other symptoms. Pt denies any sick contacts. Pt denies smoking or hx of COPD.   Past Medical History  Diagnosis Date  . Seasonal allergies   . GERD (gastroesophageal reflux disease)   . Depression   . Hypertension   . HTN (hypertension) 07/01/2011  . Allergic rhinitis, cause unspecified 07/01/2011   History reviewed. No pertinent past surgical history. Family History  Problem Relation Age of Onset  . Coronary artery disease Neg Hx   . Hypertension Mother   . Hypertension Father    History  Substance Use Topics  . Smoking status: Never Smoker   . Smokeless tobacco: Not on file  . Alcohol Use: No    Review of Systems  Constitutional: Positive for chills and fatigue. Negative for fever.  HENT: Positive for rhinorrhea. Negative for congestion, ear discharge, ear pain, sore throat and trouble swallowing.   Eyes: Negative for pain and discharge.  Respiratory: Positive for cough, chest tightness and wheezing. Negative for shortness of breath.   Cardiovascular: Negative for chest pain.  Gastrointestinal: Negative for nausea, vomiting, abdominal pain, diarrhea and constipation.  Genitourinary: Negative for dysuria and hematuria.  Musculoskeletal: Negative for joint swelling and arthralgias.  Skin:  Negative for rash.  Allergic/Immunologic: Negative for immunocompromised state.  Neurological: Positive for headaches. Negative for dizziness, weakness, light-headedness and numbness.   A complete 10 system review of systems was obtained and all systems are negative except as noted in  the HPI and PMH.     Allergies  Review of patient's allergies indicates no known allergies.  Home Medications   Prior to Admission medications   Medication Sig Start Date End Date Taking? Authorizing Provider  amLODipine (NORVASC) 10 MG tablet 1 each day for hypertension 01/25/15   Mancel Bale, MD  ibuprofen (ADVIL,MOTRIN) 200 MG tablet Take 800 mg by mouth every 6 (six) hours as needed. For headache    Historical Provider, MD   BP 139/86 mmHg  Pulse 81  Temp(Src) 97.9 F (36.6 C) (Oral)  Resp 20  SpO2 98%  Physical Exam  Constitutional: He is oriented to person, place, and time. Vital signs are normal. He appears well-developed and well-nourished.  Non-toxic appearance. No distress.  Afebrile, nontoxic, NAD  HENT:  Head: Normocephalic and atraumatic.  Right Ear: Hearing, tympanic membrane, external ear and ear canal normal.  Left Ear: Hearing, tympanic membrane, external ear and ear canal normal.  Nose: Mucosal edema and rhinorrhea present. Right sinus exhibits no maxillary sinus tenderness and no frontal sinus tenderness. Left sinus exhibits no maxillary sinus tenderness and no frontal sinus tenderness.  Mouth/Throat: Uvula is midline, oropharynx is clear and moist and mucous membranes are normal. No trismus in the jaw. No uvula swelling.  Ears clear bilaterally Nose with mild b/l nasal turbinate edema and erythema with clear rhinorrhea, no sinus TTP Oropharynx clear without tonsillar swelling or exudates  Eyes: Conjunctivae and EOM are normal. Right eye exhibits no discharge. Left eye exhibits no discharge.  Neck: Normal range of motion. Neck supple. No rigidity. Normal range of motion present.  No meningismus  Cardiovascular: Normal rate, regular rhythm, S1 normal, S2 normal, normal heart sounds and intact distal pulses.  Exam reveals no gallop and no friction rub.   No murmur heard. RRR, nl s1/s2, no m/r/g, distal pulses intact  Pulmonary/Chest: Effort normal and breath  sounds normal. No respiratory distress. He has no decreased breath sounds. He has no wheezes. He has no rhonchi. He has no rales. He exhibits no tenderness and no crepitus.  CTAB in all lung fields, no w/r/r, no hypoxia or increased WOB, speaking in full sentences, SpO2 98% on RA  No chest wall TTP or crepitus  Abdominal: Soft. Normal appearance and bowel sounds are normal. He exhibits no distension. There is no tenderness. There is no rigidity, no rebound and no guarding.  Musculoskeletal: Normal range of motion.  MAE x4 Strength and sensation grossly intact Distal pulses intact  Lymphadenopathy:       Head (right side): No submandibular and no tonsillar adenopathy present.       Head (left side): Submandibular (TTP) adenopathy present. No tonsillar adenopathy present.    He has no cervical adenopathy.  Neurological: He is alert and oriented to person, place, and time. He has normal strength. No sensory deficit.  Skin: Skin is warm, dry and intact. No rash noted.  Psychiatric: He has a normal mood and affect.  Nursing note and vitals reviewed.   ED Course  Procedures (including critical care time) DIAGNOSTIC STUDIES: Oxygen Saturation is 98% on RA, normal by my interpretation.    COORDINATION OF CARE: 3:28 PM-Discussed treatment plan which includes nebulizer treatment, CXR, flonase, nettie pot, naprosyn, prednisone, EKG with  pt at bedside and pt agreed to plan.   Labs Review Labs Reviewed - No data to display  Imaging Review Dg Chest 2 View  03/08/2015   CLINICAL DATA:  Patient comes in c/o cough with green mucous production. States he has congestion in his nose but denies it in his cheeks or above eyes. Patient states he thought he was running a low grade fever the first two days of onset but denies it recently. Onset was last Saturday. Chest tightness occurred once cough began. Denies SOB, N/V/D. Chest expansion is symmetrical. Patient states he feels as though he has been  "wheezing". Explained that he has tried OTC tylenol cold, robitussin, etc with no success. Cough gets worse when laying down. H/o HTN.  EXAM: CHEST  2 VIEW  COMPARISON:  01/25/2015.  FINDINGS: Lung volumes are low. Allowing for this, lungs are clear. No pleural effusion or pneumothorax.  Heart, mediastinum hila are unremarkable.  Bony thorax is intact.  IMPRESSION: No acute cardiopulmonary disease.   Electronically Signed   By: Amie Portland M.D.   On: 03/08/2015 16:14     EKG Interpretation   Date/Time:  Friday March 08 2015 15:11:07 EDT Ventricular Rate:  69 PR Interval:  161 QRS Duration: 82 QT Interval:  403 QTC Calculation: 432 R Axis:   1 Text Interpretation:  Sinus rhythm since last tracing no significant  change Left anterior fasicular block Abnormal ekg Confirmed by MILLER  MD,  BRIAN ([id number]) on 03/08/2015 3:58:11 PM      MDM   Final diagnoses:  Cough  URI (upper respiratory infection)  Asthma exacerbation  Costochondritis    49 y.o. male here with URI symptoms and productive cough. Possible hx of allergy induced asthma, will treat as asthma exacerbation. No chest pain complaint, just tightness with coughing. EKG WNL. Pt is afebrile with a clear lung exam, but will give inhaler and prednisone here to tx for exacerbation. CXR obtained which was clear. Lung sounds improved after inhaler. Mild rhinorrhea. Likely viral URI vs allergic component. Pt is agreeable to symptomatic treatment with close follow up with PCP as needed but spoke at length about emergent changing or worsening of symptoms that should prompt return to ER. Pt voices understanding and is agreeable to plan. Stable at time of discharge.   I personally performed the services described in this documentation, which was scribed in my presence. The recorded information has been reviewed and is accurate.  BP 139/86 mmHg  Pulse 81  Temp(Src) 97.9 F (36.6 C) (Oral)  Resp 20  SpO2 98%  Meds ordered this encounter   Medications  . albuterol (PROVENTIL HFA;VENTOLIN HFA) 108 (90 BASE) MCG/ACT inhaler 2 puff    Sig:   . naproxen (NAPROSYN) tablet 500 mg    Sig:   . predniSONE (DELTASONE) tablet 60 mg    Sig:   . benzonatate (TESSALON) 200 MG capsule    Sig: Take 1 capsule (200 mg total) by mouth 3 (three) times daily as needed for cough.    Dispense:  20 capsule    Refill:  0    Order Specific Question:  Supervising Provider    Answer:  MILLER, BRIAN [[id number]]  . predniSONE (DELTASONE) 20 MG tablet    Sig: 3 tabs po daily x 3 days    Dispense:  9 tablet    Refill:  0    Order Specific Question:  Supervising Provider    Answer:  MILLER, BRIAN [[id number]]  . albuterol (PROVENTIL HFA;VENTOLIN  HFA) 108 (90 BASE) MCG/ACT inhaler    Sig: Inhale 2 puffs into the lungs every 2 (two) hours as needed for wheezing or shortness of breath (cough).    Dispense:  1 Inhaler    Refill:  0    Order Specific Question:  Supervising Provider    Answer:  MILLER, BRIAN [[id number]]  . fluticasone (FLONASE) 50 MCG/ACT nasal spray    Sig: Place 2 sprays into both nostrils daily.    Dispense:  16 g    Refill:  0    Order Specific Question:  Supervising Provider    Answer:  MILLER, BRIAN [[id number]]  . naproxen (NAPROSYN) 500 MG tablet    Sig: Take 1 tablet (500 mg total) by mouth 2 (two) times daily as needed for mild pain, moderate pain or headache (TAKE WITH MEALS.).    Dispense:  20 tablet    Refill:  0    Order Specific Question:  Supervising Provider    Answer:  Eber HongMILLER, BRIAN [[id number]]     Dionicio Shelnutt Camprubi-Soms, PA-C 03/08/15 1650  Eber HongBrian Miller, MD 03/10/15 [phone]

## 2015-03-08 NOTE — ED Notes (Signed)
Patient comes in c/o cough with green mucous production.  States he has congestion in his nose but denies it in his cheeks or above eyes.  Patient states he thought he was running a low grade fever the first two days of onset but denies it recently.  Onset was last Saturday.  Chest tightness occurred once cough began.  Denies SOB, N/V/D.  Chest expansion is symmetrical.  Patient states he feels as though he has been "wheezing".  Explained that he has tried OTC tylenol cold, robitussin, etc with no success. Cough gets worse when laying down.

## 2015-03-08 NOTE — ED Provider Notes (Signed)
Medical screening examination/treatment/procedure(s) were performed by non-physician practitioner and as supervising physician I was immediately available for consultation/collaboration.   EKG Interpretation  Date/Time:  Friday March 08 2015 15:11:07 EDT Ventricular Rate:  69 PR Interval:  161 QRS Duration: 82 QT Interval:  403 QTC Calculation: 432 R Axis:   1 Text Interpretation:  Sinus rhythm since last tracing no significant change Left anterior fasicular block Abnormal ekg Confirmed by Hyacinth MeekerMILLER  MD, Caellum Mancil ([id number]) on 03/08/2015 3:58:11 PM        Eber HongBrian Ezelle Surprenant, MD 03/10/15 [phone]

## 2015-06-09 ENCOUNTER — Ambulatory Visit (INDEPENDENT_AMBULATORY_CARE_PROVIDER_SITE_OTHER): Payer: Self-pay | Admitting: Family Medicine

## 2015-06-09 VITALS — BP 130/72 | HR 80 | Temp 98.1°F | Resp 16 | Ht 66.5 in | Wt 208.0 lb

## 2015-06-09 DIAGNOSIS — E7800 Pure hypercholesterolemia, unspecified: Secondary | ICD-10-CM

## 2015-06-09 DIAGNOSIS — R7302 Impaired glucose tolerance (oral): Secondary | ICD-10-CM

## 2015-06-09 DIAGNOSIS — I10 Essential (primary) hypertension: Secondary | ICD-10-CM

## 2015-06-09 DIAGNOSIS — E669 Obesity, unspecified: Secondary | ICD-10-CM

## 2015-06-09 LAB — POCT GLYCOSYLATED HEMOGLOBIN (HGB A1C): Hemoglobin A1C: 5.5

## 2015-06-09 MED ORDER — AMLODIPINE BESYLATE 10 MG PO TABS: 10.0000 mg | ORAL_TABLET | Freq: Every day | ORAL | Status: DC

## 2015-06-09 NOTE — Progress Notes (Signed)
Subjective:   This chart was scribed for Keith Sorenson, MD by Andrew Au, ED Scribe. This patient was seen in room 3 and the patient's care was started at 2:02 PM.   Patient ID: [name], male    DOB: Sep 06, 1966, 49 y.o.   MRN: [id number]  HPI   Chief Complaint  Patient presents with  . Medication Refill    needs amlodipine (pended)   HPI Comments: [name] is a 49 y.o. male who presents to the [facility] for a medication refill regarding norvasc. Pt is pre diabetic. He had basic metabolic panel 4 months ago which showed elevated glucose of 181. Pt has been on Norvasc for 6-7 years and states his BP has been well. He states a month and a half ago he began using herbal life shakes for weight loss but soon developed an occipital HA with posterior neck stiffness which are symptoms that he usually develops when he stops taking his BP medication. He reports he did not stop taking medication but found out the herbal life shakes triggered elevated BP due to caffeine. Pt is seeking other ways to lose weight.  Past Medical History  Diagnosis Date  . Seasonal allergies   . GERD (gastroesophageal reflux disease)   . Depression   . Hypertension   . HTN (hypertension) 07/01/2011  . Allergic rhinitis, cause unspecified 07/01/2011   Allergies  Allergen Reactions  . Pollen Extract Other (See Comments)    Sneezing.   Prior to Admission medications   Medication Sig Start Date End Date Taking? Authorizing Provider  amLODipine (NORVASC) 10 MG tablet 1 each day for hypertension Patient taking differently: Take 10 mg by mouth daily. 1 each day for hypertension 01/25/15  Yes Mancel Bale, MD  fexofenadine-pseudoephedrine (ALLEGRA-D) 60-120 MG per tablet Take 1 tablet by mouth 2 (two) times daily as needed (allergies).   Yes Historical Provider, MD  albuterol (PROVENTIL HFA;VENTOLIN HFA) 108 (90 BASE) MCG/ACT inhaler Inhale 2 puffs into the lungs every 2 (two) hours as needed for  wheezing or shortness of breath (cough). Patient not taking: Reported on 06/09/2015 03/08/15   Mercedes Camprubi-Soms, PA-C  fluticasone (FLONASE) 50 MCG/ACT nasal spray Place 2 sprays into both nostrils daily. Patient not taking: Reported on 06/09/2015 03/08/15   Mercedes Camprubi-Soms, PA-C  ibuprofen (ADVIL,MOTRIN) 200 MG tablet Take 800 mg by mouth every 6 (six) hours as needed for headache (headache). For headache    Historical Provider, MD  naproxen (NAPROSYN) 500 MG tablet Take 1 tablet (500 mg total) by mouth 2 (two) times daily as needed for mild pain, moderate pain or headache (TAKE WITH MEALS.). Patient not taking: Reported on 06/09/2015 03/08/15   Mercedes Camprubi-Soms, PA-C   Review of Systems  Constitutional: Negative for fever and chills.  Eyes: Negative for visual disturbance.  Respiratory: Negative for shortness of breath.   Cardiovascular: Negative for chest pain and leg swelling.  Musculoskeletal: Positive for neck stiffness.  Neurological: Positive for headaches. Negative for dizziness, syncope, facial asymmetry, weakness and light-headedness.   Objective:   Physical Exam  Constitutional: He is oriented to person, place, and time. He appears well-developed and well-nourished. No distress.  HENT:  Head: Normocephalic and atraumatic.  Eyes: Conjunctivae and EOM are normal.  Neck: Neck supple.  Cardiovascular: Normal rate, regular rhythm, S1 normal, S2 normal and normal heart sounds.  Exam reveals no gallop and no friction rub.   No murmur heard. Pulmonary/Chest: Effort normal and breath sounds normal. No  respiratory distress. He has no wheezes. He has no rales. He exhibits no tenderness.  Musculoskeletal: Normal range of motion.  Neurological: He is alert and oriented to person, place, and time.  Skin: Skin is warm and dry.  Psychiatric: He has a normal mood and affect. His behavior is normal.  Nursing note and vitals reviewed.  Filed Vitals:   06/09/15 1351  BP: 130/72   Pulse: 80  Temp: 98.1 F (36.7 C)  Resp: 16  Height: 5' 6.5" (1.689 m)  Weight: 208 lb (94.348 kg)  SpO2: 99%    Results for orders placed or performed in visit on 06/09/15  POCT glycosylated hemoglobin (Hb A1C)  Result Value Ref Range   Hemoglobin A1C 5.5    Assessment & Plan:   1. Essential hypertension   2. Impaired glucose tolerance - pt reassured a1c nml  3. Elevated cholesterol   4. Obesity - pt is very worried about his obesity and frustrated by the fact that his weight is not coming down with his tlc efforts - see pt instructions to see diet changes we discussed in detail.    Orders Placed This Encounter  Procedures  . POCT glycosylated hemoglobin (Hb A1C)    Meds ordered this encounter  Medications  . amLODipine (NORVASC) 10 MG tablet    Sig: Take 1 tablet (10 mg total) by mouth daily.    Dispense:  90 tablet    Refill:  3    I personally performed the services described in this documentation, which was scribed in my presence. The recorded information has been reviewed and considered, and addended by me as needed.  Keith Sorenson, MD MPH

## 2015-06-09 NOTE — Patient Instructions (Addendum)
You should continue with the meal replacement - but ESPECIALLY for dinner - eating smaller amounts more often will be easily burned off by your body - "grazing' or just having health snacks all day will do much better for weight loss than eating a large dinner.  Make your own herbalife shakes but blending frozen fruit (try putting fresh banana or apple in) along with some water, greens (fresh spinach or kale), water.  You could add some agave or stevia for sweetening if the fruit is not enough.  You can add a protein powder as well which doesn't taste as good but will keep you full for much longer.   Your sugar level is great - You Do NOT have pre-diabetes.  Consider using a free phone app - such as LoseIt or other to help you track your calories and exercise.   Choosing the whole grain products instead of white foods is great. Make sure they are less than 1/3 of your meal.  Diabetes Mellitus and Food It is important for you to manage your blood sugar (glucose) level. Your blood glucose level can be greatly affected by what you eat. Eating healthier foods in the appropriate amounts throughout the day at about the same time each day will help you control your blood glucose level. It can also help slow or prevent worsening of your diabetes mellitus. Healthy eating may even help you improve the level of your blood pressure and reach or maintain a healthy weight.  HOW CAN FOOD AFFECT ME? Carbohydrates Carbohydrates affect your blood glucose level more than any other type of food. Your dietitian will help you determine how many carbohydrates to eat at each meal and teach you how to count carbohydrates. Counting carbohydrates is important to keep your blood glucose at a healthy level, especially if you are using insulin or taking certain medicines for diabetes mellitus. Alcohol Alcohol can cause sudden decreases in blood glucose (hypoglycemia), especially if you use insulin or take certain medicines for  diabetes mellitus. Hypoglycemia can be a life-threatening condition. Symptoms of hypoglycemia (sleepiness, dizziness, and disorientation) are similar to symptoms of having too much alcohol.  If your health care provider has given you approval to drink alcohol, do so in moderation and use the following guidelines:  Women should not have more than one drink per day, and men should not have more than two drinks per day. One drink is equal to:  12 oz of beer.  5 oz of wine.  1 oz of hard liquor.  Do not drink on an empty stomach.  Keep yourself hydrated. Have water, diet soda, or unsweetened iced tea.  Regular soda, juice, and other mixers might contain a lot of carbohydrates and should be counted. WHAT FOODS ARE NOT RECOMMENDED? As you make food choices, it is important to remember that all foods are not the same. Some foods have fewer nutrients per serving than other foods, even though they might have the same number of calories or carbohydrates. It is difficult to get your body what it needs when you eat foods with fewer nutrients. Examples of foods that you should avoid that are high in calories and carbohydrates but low in nutrients include:  Trans fats (most processed foods list trans fats on the Nutrition Facts label).  Regular soda.  Juice.  Candy.  Sweets, such as cake, pie, doughnuts, and cookies.  Fried foods. WHAT FOODS CAN I EAT? Have nutrient-rich foods, which will nourish your body and keep you healthy. The  food you should eat also will depend on several factors, including:  The calories you need.  The medicines you take.  Your weight.  Your blood glucose level.  Your blood pressure level.  Your cholesterol level. You also should eat a variety of foods, including:  Protein, such as meat, poultry, fish, tofu, nuts, and seeds (lean animal proteins are best).  Fruits.  Vegetables.  Dairy products, such as milk, cheese, and yogurt (low fat is  best).  Breads, grains, pasta, cereal, rice, and beans.  Fats such as olive oil, trans fat-free margarine, canola oil, avocado, and olives. DOES EVERYONE WITH DIABETES MELLITUS HAVE THE SAME MEAL PLAN? Because every person with diabetes mellitus is different, there is not one meal plan that works for everyone. It is very important that you meet with a dietitian who will help you create a meal plan that is just right for you. Document Released: 09/03/2005 Document Revised: 12/12/2013 Document Reviewed: 11/03/2013 [facility] Patient Information 2015 [place], [place]. This information is not intended to replace advice given to you by your health care provider. Make sure you discuss any questions you have with your health care provider. Diabetes, Eating Away From Home Sometimes, you might eat in a restaurant or have meals that are prepared by someone else. You can enjoy eating out. However, the portions in restaurants may be much larger than needed. Listed below are some ideas to help you choose foods that will keep your blood glucose (sugar) in better control.  TIPS FOR EATING OUT  Know your meal plan and how many carbohydrate servings you should have at each meal. You may wish to carry a copy of your meal plan in your purse or wallet. Learn the foods included in each food group.  Make a list of restaurants near you that offer healthy choices. Take a copy of the carry-out menus to see what they offer. Then, you can plan what you will order ahead of time.  Become familiar with serving sizes by practicing them at home using measuring cups and spoons. Once you learn to recognize portion sizes, you will be able to correctly estimate the amount of total carbohydrate you are allowed to eat at the restaurant. Ask for a takeout box if the portion is more than you should have. When your food comes, leave the amount you should have on the plate, and put the rest in the takeout box before you start eating.  Plan  ahead if your mealtime will be different from usual. Check with your caregiver to find out how to time meals and medicine if you are taking insulin.  Avoid high-fat foods, such as fried foods, cream sauces, high-fat salad dressings, or any added butter or margarine.  Do not be afraid to ask questions. Ask your server about the portion size, cooking methods, ingredients and if items can be substituted. Restaurants do not list all available items on the menu. You can ask for your main entree to be prepared using skim milk, oil instead of butter or margarine, and without gravy or sauces. Ask your waiter or waitress to serve salad dressings, gravy, sauces, margarine, and sour cream on the side. You can then add the amount your meal plan suggests.  Add more vegetables whenever possible.  Avoid items that are labeled "jumbo," "giant," "deluxe," or "supersized."  You may want to split an entre with someone and order an extra side salad.  Watch for hidden calories in foods like croutons, bacon, or cheese.  Ask  your server to take away the bread basket or chips from your table.  Order a dinner salad as an appetizer. You can eat most foods served in a restaurant. Some foods are better choices than others. Breads and Starches  Recommended: All kinds of bread (wheat, rye, white, oatmeal, [place], [place], raisin), hard or soft dinner rolls, frankfurter or hamburger buns, small bagels, small corn or whole-wheat flour tortillas.  Avoid: Frosted or glazed breads, butter rolls, egg or cheese breads, croissants, sweet rolls, pastries, coffee cake, glazed or frosted doughnuts, muffins. Crackers  Recommended: Animal crackers, graham, rye, saltine, oyster, and matzoth crackers. Bread sticks, melba toast, rusks, pretzels, popcorn (without fat), zwieback toast.  Avoid: High-fat snack crackers or chips. Buttered popcorn. Cereals  Recommended: Hot and cold cereals. Whole grains such as oatmeal or shredded  wheat are good choices.  Avoid: Sugar-coated or granola type cereals. Potatoes/Pasta/Rice/Beans  Recommended: Order baked, boiled, or mashed potatoes, rice or noodles without added fat, whole beans. Order gravies, butter, margarine, or sauces on the side so you can control the amount you add.  Avoid: Hash browns or fried potatoes. Potatoes, pasta, or rice prepared with cream or cheese sauce. Potato or pasta salads prepared with large amounts of dressing. Fried beans or fried rice. Vegetables  Recommended: Order steamed, baked, boiled, or stewed vegetables without sauces or extra fat. Ask that sauce be served on the side. If vegetables are not listed on the menu, ask what is available.  Avoid: Vegetables prepared with cream, butter, or cheese sauce. Fried vegetables. Salad Bars  Recommended: Many of the vegetables at a salad bar are considered "free." Use lemon juice, vinegar, or low-calorie salad dressing (fewer than 20 calories per serving) as "free" dressings for your salad. Look for salad bar ingredients that have no added fat or sugar such as tomatoes, lettuce, cucumbers, broccoli, carrots, onions, and mushrooms.  Avoid: Prepared salads with large amounts of dressing, such as coleslaw, caesar salad, macaroni salad, bean salad, or carrot salad. Fruit  Recommended: Eat fresh fruit or fresh fruit salad without added dressing. A salad bar often offers fresh fruit choices, but canned fruit at a restaurant is usually packed in sugar or syrup.  Avoid: Sweetened canned or frozen fruits, plain or sweetened fruit juice. Fruit salads with dressing, sour cream, or sugar added to them. Meat and Meat Substitutes  Recommended: Order broiled, baked, roasted, or grilled meat, poultry, or fish. Trim off all visible fat. Do not eat the skin of poultry. The size stated on the menu is the raw weight. Meat shrinks by  in cooking (for example, 4 oz raw equals 3 oz cooked meat).  Avoid: Deep-fat fried meat,  poultry, or fish. Breaded meats. Eggs  Recommended: Order soft, hard-cooked, poached, or scrambled eggs. Omelets may be okay, depending on what ingredients are added. Egg substitutes are also a good choice.  Avoid: Fried eggs, eggs prepared with cream or cheese sauce. Milk  Recommended: Order low-fat or fat-free milk according to your meal plan. Plain, nonfat yogurt or flavored yogurt with no sugar added may be used as a substitute for milk. Soy milk may also be used.  Avoid: Milk shakes or sweetened milk beverages. Soups and Combination Foods  Recommended: Clear broth or consomm are "free" foods and may be used as an appetizer. Broth-based soups with fat removed count as a starch serving and are preferred over cream soups. Soups made with beans or split peas may be eaten but count as a starch.  Avoid:  Fatty soups, soup made with cream, cheese soup. Combination foods prepared with excessive amounts of fat or with cream or cheese sauces. Desserts and Sweets  Recommended: Ask for fresh fruit. Sponge or angel food cake without icing, ice milk, no sugar added ice cream, sherbet, or frozen yogurt may fit into your meal plan occasionally.  Avoid: Pastries, puddings, pies, cakes with icing, custard, gelatin desserts. Fats and Oils  Recommended: Choose healthy fats such as olive oil, canola oil, or tub margarine, reduced fat or fat-free sour cream, cream cheese, avocado, or nuts.  Avoid: Any fats in excess of your allowed portion. Deep-fried foods or any food with a large amount of fat. Note: Ask for all fats to be served on the side, and limit your portion sizes according to your meal plan. Document Released: 12/07/2005 Document Revised: 02/29/2012 Document Reviewed: 03/06/2014 [facility] Patient Information 2015 [place], [place]. This information is not intended to replace advice given to you by your health care provider. Make sure you discuss any questions you have with your health care  provider.

## 2015-07-26 IMAGING — CR DG CHEST 2V
2 series · 2 of 2 positions shown · non-contrast
Comparison: 01/25/2015.

CLINICAL DATA: Patient comes in c/o cough with green mucous
production. States he has congestion in his nose but denies it in
his cheeks or above eyes. Patient states he thought he was running a
low grade fever the first two days of onset but denies it recently.
Onset was last [REDACTED]. Chest tightness occurred once cough began.
feels as though he has been "wheezing". Explained that he has tried
OTC tylenol cold, robitussin, etc with no success. Cough gets worse
when laying down. H/o HTN.

EXAM:
CHEST  2 VIEW

[w chest pa]
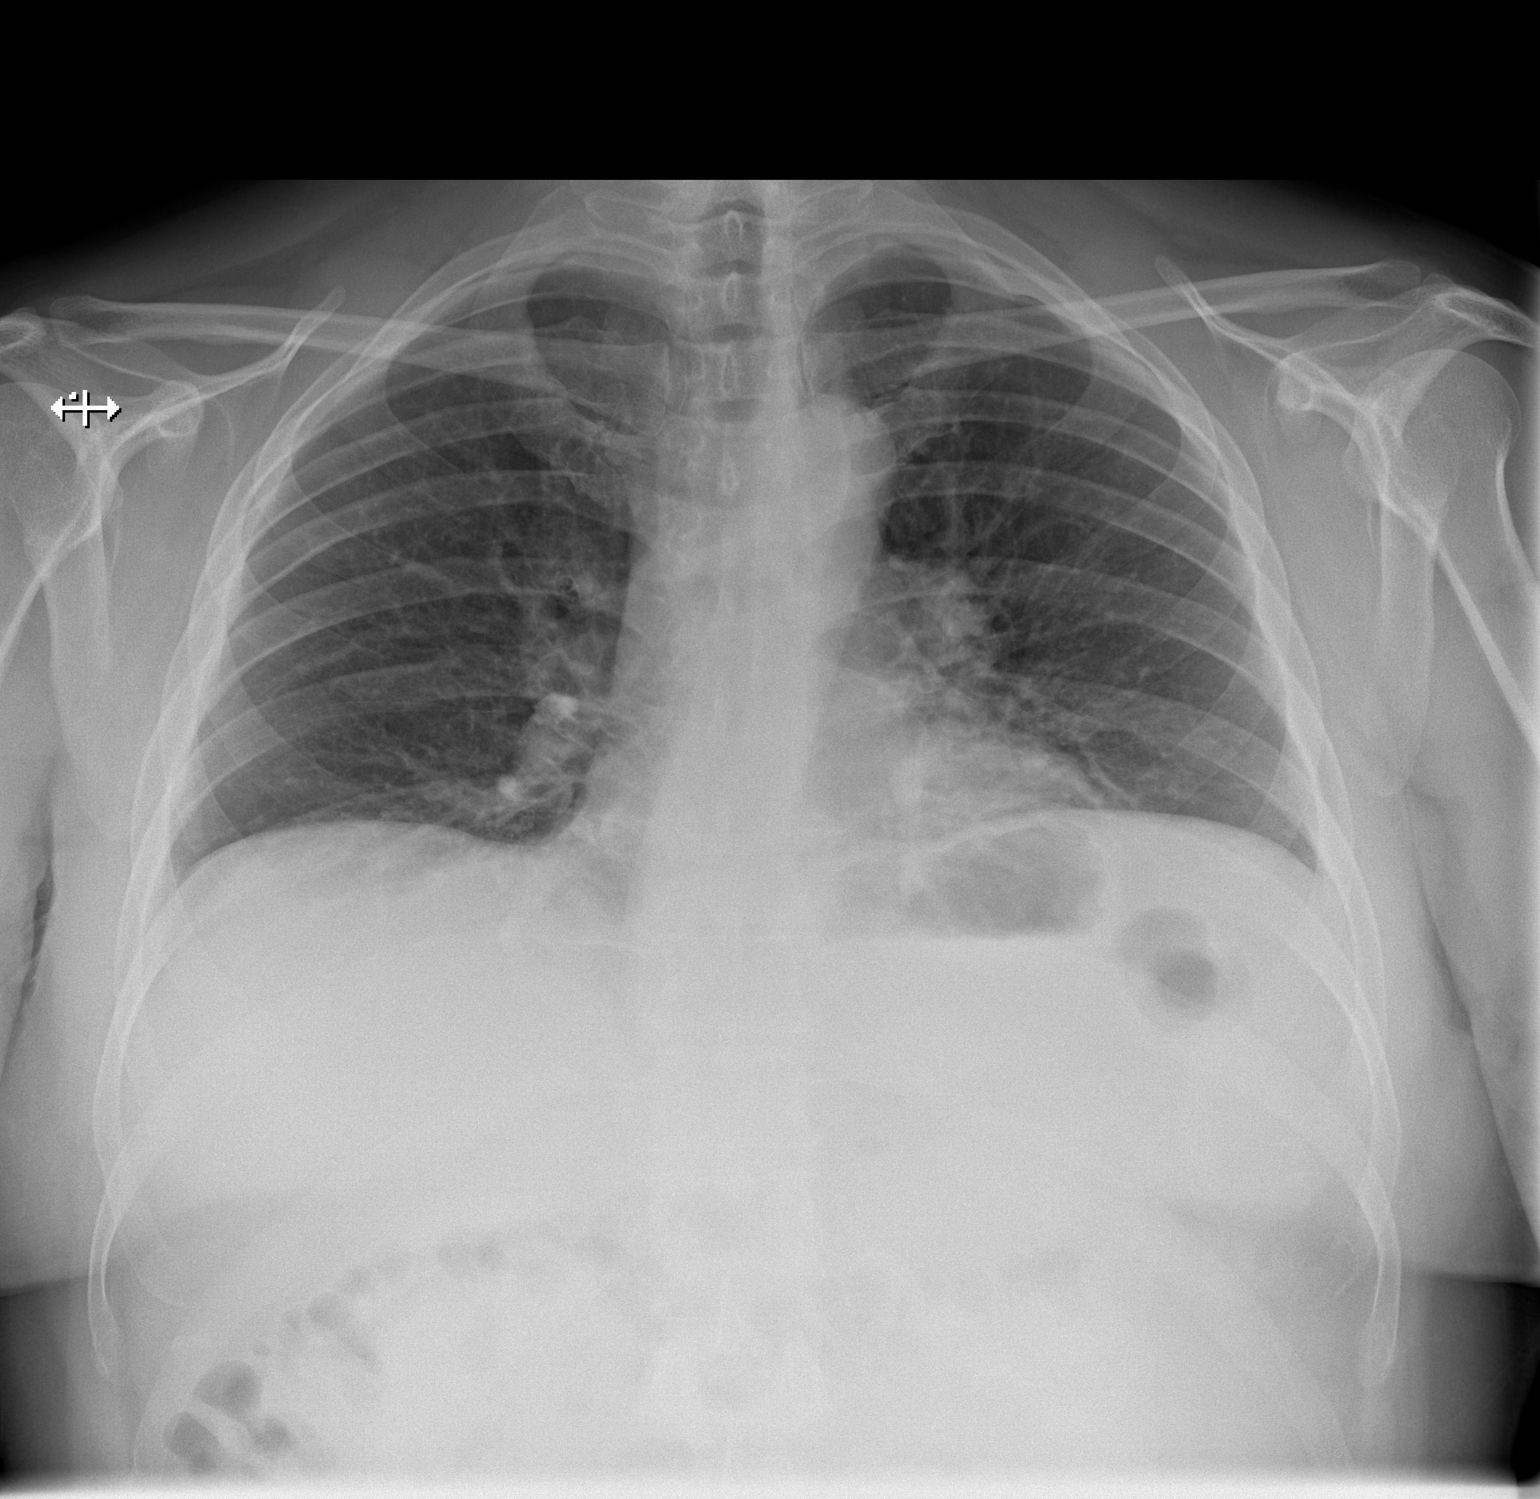

[w chest lat]
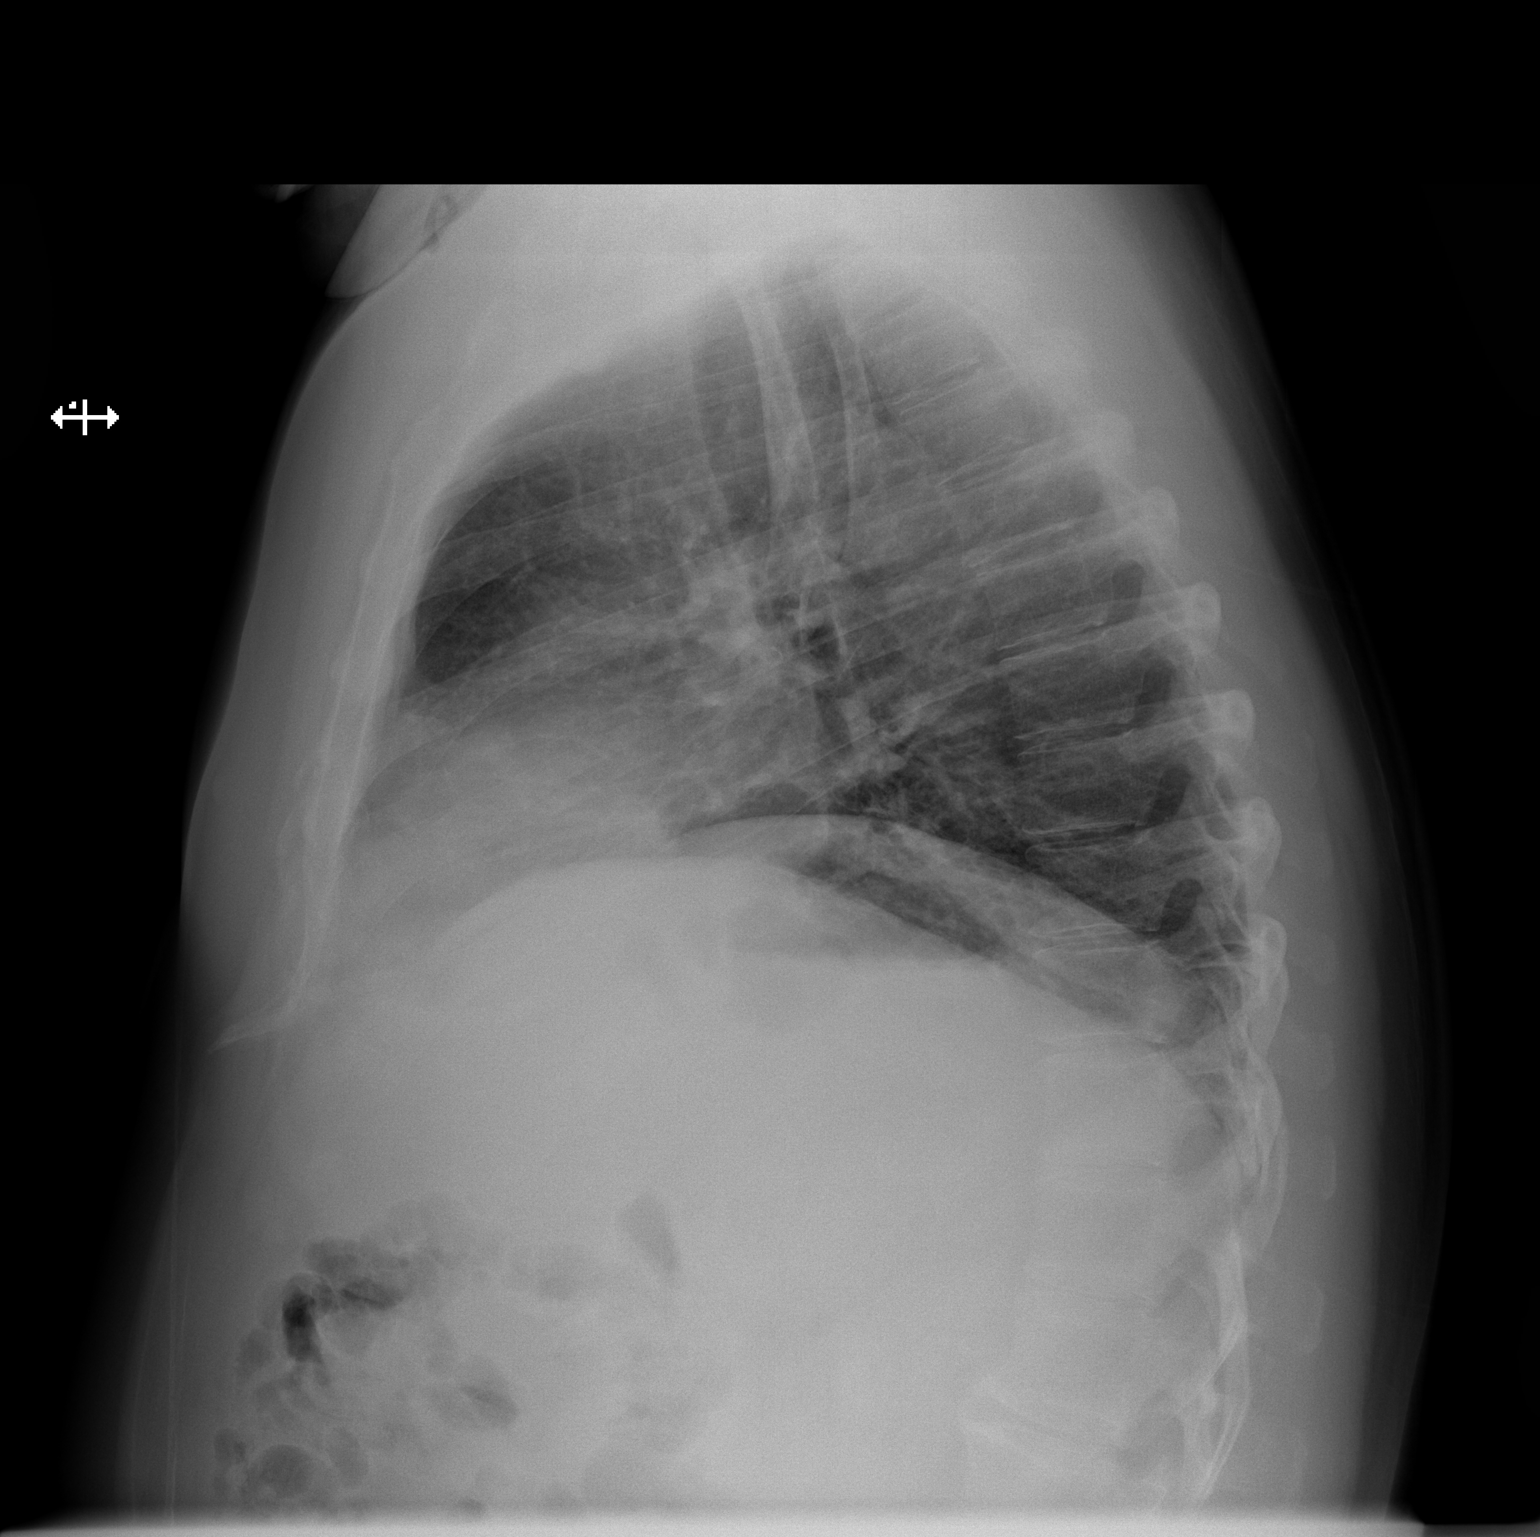

[2 of 2 positions shown; findings below may reference images not displayed]

FINDINGS: Lung volumes are low. Allowing for this, lungs are clear. No pleural
effusion or pneumothorax.

Heart, mediastinum hila are unremarkable.

Bony thorax is intact.
IMPRESSION: No acute cardiopulmonary disease.

## 2015-10-06 ENCOUNTER — Other Ambulatory Visit: Payer: Self-pay | Admitting: Family Medicine

## 2015-10-07 ENCOUNTER — Other Ambulatory Visit: Payer: Self-pay | Admitting: Family Medicine

## 2016-06-29 ENCOUNTER — Institutional Professional Consult (permissible substitution): Payer: PRIVATE HEALTH INSURANCE | Admitting: Neurology

## 2017-02-04 ENCOUNTER — Encounter: Payer: Self-pay | Admitting: Family Medicine

## 2017-02-04 ENCOUNTER — Ambulatory Visit (INDEPENDENT_AMBULATORY_CARE_PROVIDER_SITE_OTHER): Payer: 59 | Admitting: Family Medicine

## 2017-02-04 ENCOUNTER — Ambulatory Visit (INDEPENDENT_AMBULATORY_CARE_PROVIDER_SITE_OTHER): Payer: 59

## 2017-02-04 VITALS — BP 134/82 | HR 80 | Temp 98.0°F | Ht 67.0 in | Wt 219.8 lb

## 2017-02-04 DIAGNOSIS — R1011 Right upper quadrant pain: Secondary | ICD-10-CM | POA: Diagnosis not present

## 2017-02-04 DIAGNOSIS — E669 Obesity, unspecified: Secondary | ICD-10-CM | POA: Insufficient documentation

## 2017-02-04 DIAGNOSIS — J301 Allergic rhinitis due to pollen: Secondary | ICD-10-CM | POA: Diagnosis not present

## 2017-02-04 DIAGNOSIS — N200 Calculus of kidney: Secondary | ICD-10-CM

## 2017-02-04 DIAGNOSIS — Z1211 Encounter for screening for malignant neoplasm of colon: Secondary | ICD-10-CM | POA: Diagnosis not present

## 2017-02-04 DIAGNOSIS — E7800 Pure hypercholesterolemia, unspecified: Secondary | ICD-10-CM | POA: Diagnosis not present

## 2017-02-04 DIAGNOSIS — K760 Fatty (change of) liver, not elsewhere classified: Secondary | ICD-10-CM | POA: Diagnosis not present

## 2017-02-04 DIAGNOSIS — I10 Essential (primary) hypertension: Secondary | ICD-10-CM

## 2017-02-04 LAB — LIPID PANEL
Cholesterol: 246 mg/dL — ABNORMAL HIGH (ref 0–200)
HDL: 49.7 mg/dL (ref >39.00)
LDL Cholesterol: 170 mg/dL — ABNORMAL HIGH (ref 0–99)
NonHDL: 196.71
Total CHOL/HDL Ratio: 5
Triglycerides: 133 mg/dL (ref 0.0–149.0)
VLDL: 26.6 mg/dL (ref 0.0–40.0)

## 2017-02-04 LAB — COMPREHENSIVE METABOLIC PANEL
ALT: 46 U/L (ref 0–53)
AST: 32 U/L (ref 0–37)
Albumin: 4.4 g/dL (ref 3.5–5.2)
Alkaline Phosphatase: 73 U/L (ref 39–117)
BUN: 12 mg/dL (ref 6–23)
CO2: 30 mEq/L (ref 19–32)
Calcium: 9.9 mg/dL (ref 8.4–10.5)
Chloride: 101 mEq/L (ref 96–112)
Creatinine, Ser: 0.99 mg/dL (ref 0.40–1.50)
GFR: 84.51 mL/min (ref >60.00)
Glucose, Bld: 104 mg/dL — ABNORMAL HIGH (ref 70–99)
Potassium: 4.5 mEq/L (ref 3.5–5.1)
Sodium: 139 mEq/L (ref 135–145)
Total Bilirubin: 0.4 mg/dL (ref 0.2–1.2)
Total Protein: 8 g/dL (ref 6.0–8.3)

## 2017-02-04 MED ORDER — PSYLLIUM 0.52 G PO CAPS
ORAL_CAPSULE | ORAL | 3 refills | Status: DC
Start: 2017-02-04 — End: 2018-02-16

## 2017-02-04 MED ORDER — POLYETHYLENE GLYCOL 3350 17 GM/SCOOP PO POWD: 17.0000 g | Freq: Two times a day (BID) | ORAL | 1 refills | Status: DC | PRN

## 2017-02-04 NOTE — Progress Notes (Signed)
Pre visit review using our clinic review tool, if applicable. No additional management support is needed unless otherwise documented below in the visit note. 

## 2017-02-06 MED ORDER — SIMVASTATIN 20 MG PO TABS
20.0000 mg | ORAL_TABLET | Freq: Every evening | ORAL | 3 refills | Status: DC
Start: 2017-02-06 — End: 2017-03-24

## 2017-02-06 NOTE — Progress Notes (Addendum)
[name] is a 51 y.o. male is here to [facility].   History of Present Illness:    1. Right upper quadrant abdominal pain. Dull, colicky, mild to moderate pain. Started last week, has been intermittent. Worse after sitting for several hours at work. Feels full. Passing gas. Pain in RUQ with some radiation to right flank. Has tried usual treatments for kidney stones without relief. Gastrointestinal ROS: negative for - appetite loss, blood in stools, diarrhea, heartburn, hematemesis, melena, nausea/vomiting, stool incontinence or swallowing difficulty/pain. Genito-Urinary ROS: negative for - change in urinary stream, dysuria, genital discharge, pelvic pain or urinary frequency/urgency. Cardiovascular ROS: negative for - chest pain, dyspnea on exertion, irregular heartbeat or shortness of breath.    The ASCVD Risk score = 6.4%.  Health Maintenance Due  Topic Date Due  . COLONOSCOPY  07/11/2015 - ORDERED      PMHx, SurgHx, SocialHx, Medications, and Allergies were reviewed in the Visit Navigator and updated as appropriate.    Past Medical History:  Diagnosis Date  . Allergic rhinitis, cause unspecified 07/01/2011  . Depression   . GERD (gastroesophageal reflux disease)   . HTN (hypertension) 07/01/2011  . Hypertension   . Seasonal allergies     History reviewed. No pertinent surgical history.  Family History  Problem Relation Age of Onset  . Hypertension Mother   . Hypertension Father   . Coronary artery disease Neg Hx     Social History  Substance Use Topics  . Smoking status: Never Smoker  . Smokeless tobacco: Never Used  . Alcohol use No     Current Medications and Allergies:    Current Outpatient Prescriptions:  .  amLODipine (NORVASC) 10 MG tablet, TAKE ONE TABLET BY MOUTH ONCE DAILY, Disp: 90 tablet, Rfl: 2 .  fluticasone (FLONASE) 50 MCG/ACT nasal spray, Place 2 sprays into both nostrils daily., Disp: 16 g, Rfl: 0 .  polyethylene glycol powder  (GLYCOLAX/MIRALAX) powder, Take 17 g by mouth 2 (two) times daily as needed., Disp: 3350 g, Rfl: 1 .  psyllium (REGULOID) 0.52 g capsule, 2-6 CAPS PO QD - TID PRN CONSTIPATION. START WITH 2 CAPS PO QD AND INCREASE SLOWLY, MAY CONTINUE DAILY FOR REGULARITY, Disp: 160 capsule, Rfl: 3 .  simvastatin (ZOCOR) 20 MG tablet, Take 1 tablet (20 mg total) by mouth every evening., Disp: 90 tablet, Rfl: 3   Allergies  Allergen Reactions  . Pollen Extract Other (See Comments)    Sneezing.      Patient Information Form: Screening and ROS    Do you feel safe in relationships? yes PHQ-2: negative  Review of Systems  General:  Negative for unexplained weight loss, fever Skin: Negative for new or changing mole, sore that won't heal HEENT: Negative for trouble hearing, trouble seeing, ringing in ears, mouth sores, hoarseness, change in voice, dysphagia CV:  Negative for chest pain, dyspnea, edema, palpitations Resp: Negative for cough, dyspnea, hemoptysis GI: Negative for nausea, vomiting, diarrhea, melena, hematochezia GU: Negative for dysuria, incontinence, urinary hesitance, hematuria MSK: Negative for muscle cramps or aches, joint pain or swelling Neuro: Negative for headaches, weakness, numbness, dizziness, passing out/fainting Psych: Negative for depression, anxiety, memory problems   Vitals:   Vitals:   02/04/17 0909  BP: 134/82  Pulse: 80  Temp: 98 F (36.7 C)  TempSrc: Oral  SpO2: 95%  Weight: 219 lb 12.8 oz (99.7 kg)  Height: 5\' 7"  (1.702 m)     Body mass index is 34.43 kg/m.   Physical Exam:  General: Alert, cooperative, appears stated age and no distress.  HEENT:  Normocephalic, without obvious abnormality, atraumatic. Conjunctivae/corneas clear. PERRL, EOM's intact. Normal TM's and external ear canals both ears. Nares normal. Septum midline. Mucosa normal. No drainage or sinus tenderness. Lips, mucosa, and tongue normal; teeth and gums normal.  Lungs: Clear to  auscultation bilaterally.  Heart:: Regular rate and rhythm, S1, S2 normal, no murmur, click, rub or gallop.  Abdomen: Soft, no overt ttp; bowel sounds normal; no masses, no organomegaly.  Extremities: Extremities normal, atraumatic, no cyanosis or edema.  Pulses: 2+ and symmetric.  Skin: Skin color, texture, turgor normal. No rashes or lesions.  Neurologic: Alert and oriented X 3, normal strength and tone. Normal symmetric. reflexes. Normal coordination and gait.  Psych: Alert,oriented, in NAD with a full range of affect, normal behavior and no psychotic features    Results for orders placed or performed in visit on 02/04/17  Lipid panel  Result Value Ref Range   Cholesterol 246 (H) 0 - 200 mg/dL   Triglycerides 161.0 0.0 - 149.0 mg/dL   HDL 96.04 >54.09 mg/dL   VLDL 81.1 0.0 - 91.4 mg/dL   LDL Cholesterol 782 (H) 0 - 99 mg/dL   Total CHOL/HDL Ratio 5    NonHDL 196.71   Comprehensive metabolic panel  Result Value Ref Range   Sodium 139 135 - 145 mEq/L   Potassium 4.5 3.5 - 5.1 mEq/L   Chloride 101 96 - 112 mEq/L   CO2 30 19 - 32 mEq/L   Glucose, Bld 104 (H) 70 - 99 mg/dL   BUN 12 6 - 23 mg/dL   Creatinine, Ser 9.56 0.40 - 1.50 mg/dL   Total Bilirubin 0.4 0.2 - 1.2 mg/dL   Alkaline Phosphatase 73 39 - 117 U/L   AST 32 0 - 37 U/L   ALT 46 0 - 53 U/L   Total Protein 8.0 6.0 - 8.3 g/dL   Albumin 4.4 3.5 - 5.2 g/dL   Calcium 9.9 8.4 - 21.3 mg/dL   GFR 08.65 >78.46 mL/min   KUB: Scattered large and small bowel gas is noted. Fecal material is noted throughout the colon without obstructive changes. Multiple bilateral renal calculi are noted. The largest of these on the right measures approximately 5 mm. The largest on the left measures approximately 4 mm. No definitive ureteral stones are seen. No bony abnormality is noted.    Assessment and Plan:    [name] was seen today for establish care.  Diagnoses and all orders for this visit:  Right upper quadrant abdominal  pain Comments: Exam and KUB reasuring today. Seems to be more c/w increased stool burden and gas. Bowel regimen reviewed. If no improvement, to GI for further evaluation.  Orders: -     DG Abd 1 View; Future -     Comprehensive metabolic panel -     polyethylene glycol powder (GLYCOLAX/MIRALAX) powder; Take 17 g by mouth 2 (two) times daily as needed. -     psyllium (REGULOID) 0.52 g capsule; 2-6 CAPS PO QD - TID PRN CONSTIPATION. START WITH 2 CAPS PO QD AND INCREASE SLOWLY, MAY CONTINUE DAILY FOR REGULARITY  Pure hypercholesterolemia Comments: Worsened.  Orders: -     Lipid panel -     simvastatin (ZOCOR) 20 MG tablet; Take 1 tablet (20 mg total) by mouth every evening.  Obesity (BMI 30.0-34.9) Comments: Discussed healthy food choices and exercise.  Essential hypertension  Chronic seasonal allergic rhinitis due to pollen  Kidney stone  Comments: Hx of.Stable.  NAFL (nonalcoholic fatty liver)  . Reviewed expectations re: course of current medical issues. . Discussed self-management of symptoms. . Outlined signs and symptoms indicating need for more acute intervention. . Patient verbalized understanding and all questions were answered. . See orders for this visit as documented in the electronic medical record. . Patient received an After Visit Summary.  Records requested if needed. I spent 45 minutes with this patient, greater than 50% was face-to-face time counseling regarding the above diagnoses.    Helane RimaErica Hurshel Bouillon, D.O. [place], [place] 02/06/2017   Follow-up: No Follow-up on file.  Meds ordered this encounter  Medications  . polyethylene glycol powder (GLYCOLAX/MIRALAX) powder    Sig: Take 17 g by mouth 2 (two) times daily as needed.    Dispense:  3350 g    Refill:  1  . psyllium (REGULOID) 0.52 g capsule    Sig: 2-6 CAPS PO QD - TID PRN CONSTIPATION. START WITH 2 CAPS PO QD AND INCREASE SLOWLY, MAY CONTINUE DAILY FOR REGULARITY    Dispense:  160 capsule     Refill:  3  . simvastatin (ZOCOR) 20 MG tablet    Sig: Take 1 tablet (20 mg total) by mouth every evening.    Dispense:  90 tablet    Refill:  3   Medications Discontinued During This Encounter  Medication Reason  . fexofenadine-pseudoephedrine (ALLEGRA-D) 60-120 MG per tablet Error  . naproxen (NAPROSYN) 500 MG tablet Error  . albuterol (PROVENTIL HFA;VENTOLIN HFA) 108 (90 BASE) MCG/ACT inhaler Error  . ibuprofen (ADVIL,MOTRIN) 200 MG tablet    Orders Placed This Encounter  Procedures  . DG Abd 1 View  . Lipid panel  . Comprehensive metabolic panel

## 2017-02-07 ENCOUNTER — Encounter: Payer: Self-pay | Admitting: Family Medicine

## 2017-02-23 ENCOUNTER — Encounter: Payer: Self-pay | Admitting: Family Medicine

## 2017-03-24 ENCOUNTER — Encounter: Payer: Self-pay | Admitting: Family Medicine

## 2017-03-24 ENCOUNTER — Ambulatory Visit (INDEPENDENT_AMBULATORY_CARE_PROVIDER_SITE_OTHER): Payer: 59 | Admitting: Family Medicine

## 2017-03-24 VITALS — BP 134/88 | HR 71 | Temp 97.8°F | Ht 67.0 in | Wt 220.4 lb

## 2017-03-24 DIAGNOSIS — R079 Chest pain, unspecified: Secondary | ICD-10-CM | POA: Diagnosis not present

## 2017-03-24 DIAGNOSIS — E7800 Pure hypercholesterolemia, unspecified: Secondary | ICD-10-CM

## 2017-03-24 DIAGNOSIS — X509XXA Other and unspecified overexertion or strenuous movements or postures, initial encounter: Secondary | ICD-10-CM | POA: Diagnosis not present

## 2017-03-24 DIAGNOSIS — J301 Allergic rhinitis due to pollen: Secondary | ICD-10-CM | POA: Diagnosis not present

## 2017-03-24 MED ORDER — MONTELUKAST SODIUM 10 MG PO TABS: 10.0000 mg | ORAL_TABLET | Freq: Every day | ORAL | 3 refills | Status: DC

## 2017-03-24 MED ORDER — FLUTICASONE PROPIONATE 50 MCG/ACT NA SUSP: 2.0000 | Freq: Every day | NASAL | 6 refills | Status: DC

## 2017-03-24 NOTE — Progress Notes (Signed)
Pre visit review using our clinic review tool, if applicable. No additional management support is needed unless otherwise documented below in the visit note. 

## 2017-03-24 NOTE — Progress Notes (Signed)
[name] is a 51 y.o. male here for a new problem.  History of Present Illness:   Britt Bottom CMA acting as scribe for Dr. Earlene Plater.  Chief Complaint  Patient presents with  . Hypertension    Patient feels like his BP has been elevated. He is checking at home but they have been in the normal range.    HPI:  1. Chronic seasonal allergic rhinitis due to pollen. Previously took Allegra-D with relief until I warned him about the risk of HTN at our last visit. He states that Allegra was not helpful alone. Now, experiencing rhinitis, PND, dry cough. Not severe, but he is concerned because it is early in the allergy season. He wants recommendations re: how to manage when pollen is severe.   2. Adverse effect of exertion, The patient had a somewhat difficult time explaining this issue. He has one flight of stairs in his home. He usually has no or very mild issues going up the stairs (SOB, fatigue). However, if he carries an item upstairs, such as a box that he says is not really heavy, he will become severely fatigued after - to the point that he needs to lie down. He also develops an immediate headache and becomes nauseated. No CP, SOB, palpitations. No focal weakness or paresthesias. No dizziness. The symptoms last for minutes to about an hour before going away.    PMHx, SurgHx, SocialHx, Medications, and Allergies were reviewed in the Visit Navigator and updated as appropriate.  Current Medications:   .  amLODipine (NORVASC) 10 MG tablet, TAKE ONE TABLET BY MOUTH ONCE DAILY, Disp: 90 tablet, Rfl: 2  Review of Systems:   Review of Systems  Constitutional: Positive for malaise/fatigue. Negative for chills and fever.  HENT: Negative for ear pain, sinus pain and sore throat.   Eyes: Negative for blurred vision and double vision.  Respiratory: Negative for shortness of breath and wheezing.   Cardiovascular: Negative for chest pain, palpitations and leg swelling.  Gastrointestinal: Negative  for abdominal pain, constipation and diarrhea.  Genitourinary: Negative for dysuria.  Musculoskeletal: Negative for back pain, myalgias and neck pain.  Neurological: Positive for headaches. Negative for dizziness.  Psychiatric/Behavioral: Negative for depression, hallucinations and memory loss.    Vitals:   Vitals:   03/24/17 1129  BP: 134/88  Pulse: 71  Temp: 97.8 F (36.6 C)  TempSrc: Oral  SpO2: 97%  Weight: 220 lb 6.4 oz (100 kg)  Height:  (1.702 m)     Body mass index is 34.52 kg/m.  Physical Exam:   Physical Exam  Constitutional: He is oriented to person, place, and time. He appears well-developed and well-nourished. No distress.  HENT:  Head: Normocephalic and atraumatic.  Right Ear: External ear normal.  Left Ear: External ear normal.  Nose: Rhinorrhea present.  Mouth/Throat: Oropharynx is clear and moist.  Eyes: Conjunctivae and EOM are normal. Pupils are equal, round, and reactive to light.  Neck: Normal range of motion. Neck supple.  Cardiovascular: Normal rate, regular rhythm, normal heart sounds and intact distal pulses.   Pulmonary/Chest: Effort normal and breath sounds normal.  Abdominal: Soft. Bowel sounds are normal.  Musculoskeletal: Normal range of motion.  Neurological: He is alert and oriented to person, place, and time.  Skin: Skin is warm and dry.  Psychiatric: He has a normal mood and affect. His behavior is normal. Judgment and thought content normal.  Nursing note and vitals reviewed.  EKG: normal EKG, normal sinus rhythm.  Assessment  and Plan:    Jas was seen today for hypertension.  Diagnoses and all orders for this visit:  Chronic seasonal allergic rhinitis due to pollen Comments: Reviewed symptomatic care. Orders: -     fluticasone (FLONASE) 50 MCG/ACT nasal spray; Place 2 sprays into both nostrils daily. -     montelukast (SINGULAIR) 10 MG tablet; Take 1 tablet (10 mg total) by mouth at bedtime.  Chest pain,  atypical Orders: -     EKG 12-Lead  Adverse effect of exertion, initial encounter Comments: Patient with cardiovascular risk factors. Will refer to Cardiology to see if further testing is indicated (ECHO, Holter, Angiography).  Orders: -     Ambulatory referral to Cardiology  Pure hypercholesterolemia Comments: The patient stopped his statin.   . Reviewed expectations re: course of current medical issues. . Discussed self-management of symptoms. . Outlined signs and symptoms indicating need for more acute intervention. . Patient verbalized understanding and all questions were answered. . See orders for this visit as documented in the electronic medical record. . Patient received an After-Visit Summary.  CMA served as Neurosurgeon during this visit. History, Physical, and Plan performed by medical provider. Documentation and orders reviewed and attested to. Helane Rima, D.O.  Helane Rima, D.O.

## 2017-03-30 ENCOUNTER — Encounter (HOSPITAL_COMMUNITY): Payer: Self-pay | Admitting: *Deleted

## 2017-03-30 ENCOUNTER — Emergency Department (HOSPITAL_COMMUNITY)
Admission: EM | Admit: 2017-03-30 | Discharge: 2017-03-31 | Disposition: A | Discharge status: Home / Self Care | Payer: 59 | Attending: Emergency Medicine | Admitting: Emergency Medicine

## 2017-03-30 DIAGNOSIS — G44209 Tension-type headache, unspecified, not intractable: Secondary | ICD-10-CM | POA: Insufficient documentation

## 2017-03-30 DIAGNOSIS — I10 Essential (primary) hypertension: Secondary | ICD-10-CM

## 2017-03-30 NOTE — ED Notes (Signed)
Patient states that HA began about 4pm today and has progressively gotten worse.  Patient states that pain is at the right and left temple and feels like pressure and squeezing.  States that his BP has been "high" all day, denies CP and SOB.  Patient states that had an episode last week when he was lifting a box and his head began to hurt and his BP became elevated at this time as well.

## 2017-03-30 NOTE — ED Triage Notes (Signed)
Pt complains of hypertension and headache for the past couple of hours today. Pt states he has pain in the right side of his head. Pt denies n/v or dizziness.

## 2017-03-31 MED ORDER — ACETAMINOPHEN 325 MG PO TABS
650.0000 mg | ORAL_TABLET | Freq: Once | ORAL | Status: AC
  Administered 2017-03-31: 650 mg via ORAL
  Filled 2017-03-31: qty 2

## 2017-03-31 MED ORDER — OXYCODONE-ACETAMINOPHEN 5-325 MG PO TABS
1.0000 | ORAL_TABLET | Freq: Once | ORAL | Status: DC
  Filled 2017-03-31: qty 1

## 2017-03-31 NOTE — ED Notes (Signed)
Patient advised that he was driving, informed MD and d/c'd order for percocet.

## 2017-03-31 NOTE — ED Notes (Signed)
ED Provider at bedside. 

## 2017-03-31 NOTE — ED Notes (Signed)
Advised patient to use call bell for assistance.  Also, advised patient that if has to use restroom to please provide a sample.

## 2017-03-31 NOTE — ED Provider Notes (Signed)
[facility]-EMERGENCY DEPT Provider Note   CSN: [id number] Arrival date & time: 03/30/17  2210   By signing my name below, I, Clarisse Gouge, attest that this documentation has been prepared under the direction and in the presence of Azalia Bilis, MD. Electronically signed, Clarisse Gouge, ED Scribe. 03/31/17. 12:47 AM.   History   Chief Complaint Chief Complaint  Patient presents with  . Headache  . Hypertension   The history is provided by the patient and medical records. No language interpreter was used.    [name] is a 51 y.o. male with h/o HTN, obesity and HLD transported by his spouse to the Emergency Department with concern for progressively worsening, constant, pressure like headache in bilateral temples since 4 PM yesterday at work. He describes 9/10 pain. He adds concern for multiple high blood pressure readings at home yesterday. Pt notes increased stress at work, BUE muscle discomfort and flushing in the face and ears. Pt recently quit drinking sodas and increased fruit and vegetable intake. His wife notes 1 episode of HA 2 weeks ago after carrying boxes up stairs. He states he is compliant with amlodipine 10 mg x 10 years. Pt has taken ibuprofen with no relief to headache. 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 He denies visual changes, chest pain, SOB, speech difficulty and gait changes.  Past Medical History:  Diagnosis Date  . Allergic rhinitis, cause unspecified 07/01/2011  . Depression   . GERD (gastroesophageal reflux disease)   . HTN (hypertension) 07/01/2011  . Hypertension   . Seasonal allergies     Patient Active Problem List   Diagnosis Date Noted  . NAFL (nonalcoholic fatty liver) 02/04/2017  . Obesity (BMI 30.0-34.9) 02/04/2017  . Kidney stone 07/01/2011  . HTN (hypertension) 07/01/2011  . Allergic rhinitis due to pollen 07/01/2011  . GERD (gastroesophageal reflux disease) 07/01/2011  . HLD (hyperlipidemia) 07/01/2011    History  reviewed. No pertinent surgical history.     Home Medications    Prior to Admission medications   Medication Sig Start Date End Date Taking? Authorizing Provider  amLODipine (NORVASC) 10 MG tablet TAKE ONE TABLET BY MOUTH ONCE DAILY 10/07/15  Yes Sherren Mocha, MD  fluticasone [facility]) 50 MCG/ACT nasal spray Place 2 sprays into both nostrils daily. 03/24/17  Yes Helane Rima, DO  ibuprofen (ADVIL,MOTRIN) 200 MG tablet Take 400 mg by mouth every 6 (six) hours as needed for moderate pain.   Yes Historical Provider, MD  montelukast (SINGULAIR) 10 MG tablet Take 1 tablet (10 mg total) by mouth at bedtime. Patient not taking: Reported on 03/31/2017 03/24/17   Helane Rima, DO  polyethylene glycol powder (GLYCOLAX/MIRALAX) powder Take 17 g by mouth 2 (two) times daily as needed. Patient not taking: Reported on 03/31/2017 02/04/17   Helane Rima, DO  psyllium (REGULOID) 0.52 g capsule 2-6 CAPS PO QD - TID PRN CONSTIPATION. START WITH 2 CAPS PO QD AND INCREASE SLOWLY, MAY CONTINUE DAILY FOR REGULARITY Patient not taking: Reported on 03/31/2017 02/04/17   Helane Rima, DO    Family History Family History  Problem Relation Age of Onset  . Hypertension Mother   . Hypertension Father   . Coronary artery disease Neg Hx     Social History Social History  Substance Use Topics  . Smoking status: Never Smoker  . Smokeless tobacco: Never Used  . Alcohol use No     Allergies   Pollen extract and Zocor [simvastatin]   Review of Systems Review of  Systems 10 Systems reviewed and all are negative for acute change except as noted in the HPI.    Physical Exam Updated Vital Signs BP (!) 169/101 (BP Location: Left Arm)   Pulse 81   Temp 98 F (36.7 C) (Oral)   Resp 20   Ht  (1.702 m)   Wt 220 lb (99.8 kg)   SpO2 98%   BMI 34.46 kg/m   Physical Exam  Constitutional: He is oriented to person, place, and time. He appears well-developed and well-nourished.  HENT:  Head: Normocephalic and  atraumatic.  Eyes: EOM are normal. Pupils are equal, round, and reactive to light.  Neck: Normal range of motion.  Cardiovascular: Normal rate, regular rhythm, normal heart sounds and intact distal pulses.   Pulmonary/Chest: Effort normal and breath sounds normal. No respiratory distress.  Abdominal: Soft. He exhibits no distension. There is no tenderness.  Musculoskeletal: Normal range of motion.  Neurological: He is alert and oriented to person, place, and time.  5/5 strength in major muscle groups of  bilateral upper and lower extremities. Speech normal. No facial asymetry.   Skin: Skin is warm and dry.  Psychiatric: He has a normal mood and affect. Judgment normal.  Nursing note and vitals reviewed.    ED Treatments / Results  DIAGNOSTIC STUDIES: Oxygen Saturation is 98% on RA, normal by my interpretation.    COORDINATION OF CARE: 12:43 AM Discussed treatment plan with pt at bedside and pt agreed to plan. Will order medications  Labs (all labs ordered are listed, but only abnormal results are displayed) Labs Reviewed - No data to display  EKG  EKG Interpretation None       Radiology No results found.  Procedures Procedures (including critical care time)  Medications Ordered in ED Medications  oxyCODONE-acetaminophen (PERCOCET/ROXICET) 5-325 MG per tablet 1 tablet (not administered)  acetaminophen (TYLENOL) tablet 650 mg (not administered)     Initial Impression / Assessment and Plan / ED Course  I have reviewed the triage vital signs and the nursing notes.  Pertinent labs & imaging results that were available during my care of the patient were reviewed by me and considered in my medical decision making (see chart for details).     Patient is overall well-appearing.  Nonfocal neurologic exam.  I suspect much of his headache is more tension type headache.  I made recommendations for dietary and exercise changes to help with his blood pressure.  He will monitor  ambulatory blood pressures at home.  Primary care follow-up.  He understands return to the ER for new or worsening symptoms  Final Clinical Impressions(s) / ED Diagnoses   Final diagnoses:  Tension headache  Hypertension, unspecified type    New Prescriptions New Prescriptions   No medications on file   I personally performed the services described in this documentation, which was scribed in my presence. The recorded information has been reviewed and is accurate.        Azalia Bilis, MD 03/31/17 [phone]

## 2017-03-31 NOTE — ED Notes (Signed)
Patient d/c'd self care.  F/U reviewed and patient given opportunity to ask questions and receive teaching.  Patient verbalized understanding of D/C instructions.

## 2017-04-16 ENCOUNTER — Ambulatory Visit: Payer: 59 | Admitting: Cardiovascular Disease

## 2017-05-20 ENCOUNTER — Encounter: Payer: Self-pay | Admitting: Family Medicine

## 2017-05-20 ENCOUNTER — Telehealth: Payer: Self-pay | Admitting: Surgical

## 2017-05-20 MED ORDER — AMLODIPINE BESYLATE 10 MG PO TABS: 10.0000 mg | ORAL_TABLET | Freq: Every day | ORAL | 2 refills | Status: DC

## 2017-05-20 NOTE — Telephone Encounter (Signed)
RX sent to pharmacy  

## 2018-02-16 ENCOUNTER — Ambulatory Visit (INDEPENDENT_AMBULATORY_CARE_PROVIDER_SITE_OTHER): Payer: 59 | Admitting: Family Medicine

## 2018-02-16 ENCOUNTER — Encounter: Payer: Self-pay | Admitting: Family Medicine

## 2018-02-16 VITALS — BP 140/80 | HR 77 | Temp 98.4°F | Wt 220.4 lb

## 2018-02-16 DIAGNOSIS — J301 Allergic rhinitis due to pollen: Secondary | ICD-10-CM | POA: Diagnosis not present

## 2018-02-16 DIAGNOSIS — E669 Obesity, unspecified: Secondary | ICD-10-CM | POA: Diagnosis not present

## 2018-02-16 DIAGNOSIS — K760 Fatty (change of) liver, not elsewhere classified: Secondary | ICD-10-CM | POA: Diagnosis not present

## 2018-02-16 DIAGNOSIS — I10 Essential (primary) hypertension: Secondary | ICD-10-CM | POA: Diagnosis not present

## 2018-02-16 DIAGNOSIS — E7800 Pure hypercholesterolemia, unspecified: Secondary | ICD-10-CM | POA: Diagnosis not present

## 2018-02-16 DIAGNOSIS — L720 Epidermal cyst: Secondary | ICD-10-CM | POA: Diagnosis not present

## 2018-02-16 DIAGNOSIS — G4730 Sleep apnea, unspecified: Secondary | ICD-10-CM | POA: Diagnosis not present

## 2018-02-16 LAB — LIPID PANEL
Cholesterol: 226 mg/dL — ABNORMAL HIGH (ref 0–200)
HDL: 46.7 mg/dL (ref >39.00)
LDL Cholesterol: 158 mg/dL — ABNORMAL HIGH (ref 0–99)
NonHDL: 179.26
Total CHOL/HDL Ratio: 5
Triglycerides: 106 mg/dL (ref 0.0–149.0)
VLDL: 21.2 mg/dL (ref 0.0–40.0)

## 2018-02-16 LAB — COMPREHENSIVE METABOLIC PANEL
ALT: 26 U/L (ref 0–53)
AST: 21 U/L (ref 0–37)
Albumin: 4 g/dL (ref 3.5–5.2)
Alkaline Phosphatase: 67 U/L (ref 39–117)
BUN: 16 mg/dL (ref 6–23)
CO2: 31 mEq/L (ref 19–32)
Calcium: 9.9 mg/dL (ref 8.4–10.5)
Chloride: 102 mEq/L (ref 96–112)
Creatinine, Ser: 0.88 mg/dL (ref 0.40–1.50)
GFR: 96.43 mL/min (ref >60.00)
Glucose, Bld: 101 mg/dL — ABNORMAL HIGH (ref 70–99)
Potassium: 4 mEq/L (ref 3.5–5.1)
Sodium: 139 mEq/L (ref 135–145)
Total Bilirubin: 0.4 mg/dL (ref 0.2–1.2)
Total Protein: 7.6 g/dL (ref 6.0–8.3)

## 2018-02-16 NOTE — Patient Instructions (Addendum)
The 10-year ASCVD risk score Denman George(Goff DC Montez HagemanJr., et al., 2013) is: 7.6%   Values used to calculate the score:     Age: 89 years     Sex: Male     Is Non-Hispanic African American: No     Diabetic: No     Tobacco smoker: No     Systolic Blood Pressure: 140 mmHg     Is BP treated: Yes     HDL Cholesterol: 49.7 mg/dL     Total Cholesterol: 246 mg/dL

## 2018-02-16 NOTE — Progress Notes (Signed)
[name] is a 52 y.o. male is here for follow up.  History of Present Illness:   [name], CMA acting as scribe for Dr. Helane Buckley.   HPI: Patient has two cyst one on back of neck on right side and on back just below on same side. Noticed both of them over a year ago might have been longer. He does not have any tenderness in area or and drainage from either one ever. No redness and has not noticed any change in size. He did mention that the one on his back "popped" one time and when it came back it was hard but that was over a year ago. No changes after that.   He also has problems with headache but has noticed on left side only. After he started Flonase it has gotten better.   Review of Systems  Constitutional: Negative for chills and fever.  HENT: Negative for ear pain.   Eyes: Negative for blurred vision and double vision.  Respiratory: Negative for cough.   Cardiovascular: Negative for chest pain and palpitations.  Gastrointestinal: Negative for nausea and vomiting.  Genitourinary: Negative for dysuria and urgency.  Musculoskeletal: Negative for myalgias.  Neurological: Negative for dizziness.   There are no preventive care reminders to display for this patient.   Depression screen [facility] 2/9 02/06/2017 02/04/2017 06/09/2015  Decreased Interest 0 0 0  Down, Depressed, Hopeless 0 0 0  PHQ - 2 Score 0 0 0   PMHx, SurgHx, SocialHx, FamHx, Medications, and Allergies were reviewed in the Visit Navigator and updated as appropriate.   Patient Active Problem List   Diagnosis Date Noted  . NAFL (nonalcoholic fatty liver) 02/04/2017  . Obesity (BMI 30.0-34.9) 02/04/2017  . Kidney stone 07/01/2011  . HTN (hypertension) 07/01/2011  . Allergic rhinitis due to pollen 07/01/2011  . GERD (gastroesophageal reflux disease) 07/01/2011  . HLD (hyperlipidemia) 07/01/2011   Social History   Tobacco Use  . Smoking status: Never Smoker  . Smokeless tobacco: Never Used  Substance  Use Topics  . Alcohol use: No  . Drug use: No   Current Medications and Allergies:   .  amLODipine (NORVASC) 10 MG tablet, Take 1 tablet (10 mg total) by mouth daily., Disp: 90 tablet, Rfl: 2 .  fluticasone (FLONASE) 50 MCG/ACT nasal spray, Place 2 sprays into both nostrils daily., Disp: 16 g, Rfl: 6 .  ibuprofen (ADVIL,MOTRIN) 200 MG tablet, Take 400 mg by mouth every 6 (six) hours as needed for moderate pain., Disp: , Rfl:   Allergies  Allergen Reactions  . Pollen Extract Other (See Comments)    Sneezing.  . Zocor [Simvastatin] Other (See Comments)    Stomach Cramps   Review of Systems   Pertinent items are noted in the HPI. Otherwise, ROS is negative.  Vitals:   Vitals:   02/16/18 1037  BP: 140/80  Pulse: 77  Temp: 98.4 F (36.9 C)  TempSrc: Oral  SpO2: 98%  Weight: 220 lb 6.4 oz (100 kg)     Body mass index is 34.52 kg/m.  Physical Exam:   Physical Exam  Constitutional: He is oriented to person, place, and time. He appears well-developed and well-nourished. No distress.  HENT:  Head: Normocephalic and atraumatic.  Right Ear: External ear normal.  Left Ear: External ear normal.  Nose: Nose normal.  Mouth/Throat: Oropharynx is clear and moist.  Eyes: Conjunctivae and EOM are normal. Pupils are equal, round, and reactive to light.  Neck: Normal range of  motion. Neck supple.  Cardiovascular: Normal rate, regular rhythm, normal heart sounds and intact distal pulses.  Pulmonary/Chest: Effort normal and breath sounds normal.  Abdominal: Soft. Bowel sounds are normal.  Musculoskeletal: Normal range of motion.  Neurological: He is alert and oriented to person, place, and time.  Skin: Skin is warm and dry.  Two large, non-infected, epidermoid cysts on upper back on neck.  Psychiatric: He has a normal mood and affect. His behavior is normal. Judgment and thought content normal.  Nursing note and vitals reviewed.   Results for orders placed or performed in visit on  02/16/18  Comprehensive metabolic panel  Result Value Ref Range   Sodium 139 135 - 145 mEq/L   Potassium 4.0 3.5 - 5.1 mEq/L   Chloride 102 96 - 112 mEq/L   CO2 31 19 - 32 mEq/L   Glucose, Bld 101 (H) 70 - 99 mg/dL   BUN 16 6 - 23 mg/dL   Creatinine, Ser 4.09 0.40 - 1.50 mg/dL   Total Bilirubin 0.4 0.2 - 1.2 mg/dL   Alkaline Phosphatase 67 39 - 117 U/L   AST 21 0 - 37 U/L   ALT 26 0 - 53 U/L   Total Protein 7.6 6.0 - 8.3 g/dL   Albumin 4.0 3.5 - 5.2 g/dL   Calcium 9.9 8.4 - 81.1 mg/dL   GFR 91.47 >82.95 mL/min  Lipid panel  Result Value Ref Range   Cholesterol 226 (H) 0 - 200 mg/dL   Triglycerides 621.3 0.0 - 149.0 mg/dL   HDL 08.65 >78.46 mg/dL   VLDL 96.2 0.0 - 95.2 mg/dL   LDL Cholesterol 841 (H) 0 - 99 mg/dL   Total CHOL/HDL Ratio 5    NonHDL 179.26    Assessment and Plan:   1. Epidermoid cyst - Ambulatory referral to Dermatology  2. Chronic seasonal allergic rhinitis due to pollen We reviewed treatments including over-the-counter antihistamines plus Flonase.  We also discussed red flags.  3. Pure hypercholesterolemia Cardiovascular ROS: no chest pain or dyspnea on exertion.   Lipids:    Component Value Date/Time   CHOL 226 (H) 02/16/2018 1109   TRIG 106.0 02/16/2018 1109   HDL 46.70 02/16/2018 1109   LDLDIRECT 173.8 07/01/2011 1414   VLDL 21.2 02/16/2018 1109   CHOLHDL 5 02/16/2018 1109   - Comprehensive metabolic panel - Lipid panel  4. Essential hypertension Avoiding excessive salt intake? []   YES  [x]   NO Trying to exercise on a regular basis? []   YES  [x]   NO Review: taking medications as instructed, no medication side effects noted, no TIAs, no chest pain on exertion, no dyspnea on exertion, no swelling of ankles.  Smoker: No.  Wt Readings from Last 3 Encounters:  02/16/18 220 lb 6.4 oz (100 kg)  03/30/17 220 lb (99.8 kg)  03/24/17 220 lb 6.4 oz (100 kg)   BP Readings from Last 3 Encounters:  02/16/18 140/80  03/31/17 (!) 146/87  03/24/17  134/88   Lab Results  Component Value Date   CREATININE 0.88 02/16/2018   5. NAFL (nonalcoholic fatty liver)  6. Obesity (BMI 30.0-34.9) The patient is asked to make an attempt to improve diet and exercise patterns to aid in medical management of this problem.   7. Sleep-disordered breathing - Home sleep test   . Reviewed expectations re: course of current medical issues. . Discussed self-management of symptoms. . Outlined signs and symptoms indicating need for more acute intervention. . Patient verbalized understanding and all questions were  answered. Marland Kitchen. Health Maintenance issues including appropriate healthy diet, exercise, and smoking avoidance were discussed with patient. . See orders for this visit as documented in the electronic medical record. . Patient received an After Visit Summary.  CMA served as Neurosurgeonscribe during this visit. History, Physical, and Plan performed by medical provider. The above documentation has been reviewed and is accurate and complete. Keith RimaErica Vauda Salvucci, D.O.  Keith RimaErica Linnaea Ahn, DO [place] [place] [facility] 02/19/2018

## 2018-02-19 DIAGNOSIS — L720 Epidermal cyst: Secondary | ICD-10-CM | POA: Insufficient documentation

## 2018-02-19 DIAGNOSIS — G4730 Sleep apnea, unspecified: Secondary | ICD-10-CM | POA: Insufficient documentation

## 2018-02-20 ENCOUNTER — Encounter: Payer: Self-pay | Admitting: Family Medicine

## 2018-02-21 MED ORDER — ROSUVASTATIN CALCIUM 5 MG PO TABS: 5.0000 mg | ORAL_TABLET | Freq: Every day | ORAL | 3 refills | Status: DC

## 2018-02-24 ENCOUNTER — Encounter: Payer: Self-pay | Admitting: Family Medicine

## 2018-02-25 ENCOUNTER — Other Ambulatory Visit: Payer: Self-pay | Admitting: Surgical

## 2018-02-25 MED ORDER — ROSUVASTATIN CALCIUM 5 MG PO TABS: 5.0000 mg | ORAL_TABLET | Freq: Every day | ORAL | 3 refills | Status: DC

## 2018-03-20 ENCOUNTER — Encounter: Payer: Self-pay | Admitting: Family Medicine

## 2018-03-22 ENCOUNTER — Other Ambulatory Visit: Payer: Self-pay | Admitting: Surgical

## 2018-03-22 MED ORDER — MONTELUKAST SODIUM 10 MG PO TABS: 10.0000 mg | ORAL_TABLET | Freq: Every day | ORAL | 3 refills | Status: DC

## 2018-04-30 ENCOUNTER — Other Ambulatory Visit: Payer: Self-pay | Admitting: Family Medicine

## 2018-05-01 ENCOUNTER — Encounter: Payer: Self-pay | Admitting: Family Medicine

## 2018-05-02 ENCOUNTER — Other Ambulatory Visit: Payer: Self-pay | Admitting: Surgical

## 2018-05-02 MED ORDER — AMLODIPINE BESYLATE 10 MG PO TABS: 10.0000 mg | ORAL_TABLET | Freq: Every day | ORAL | 2 refills | Status: DC

## 2018-12-30 ENCOUNTER — Encounter: Payer: Self-pay | Admitting: Family Medicine

## 2019-04-20 ENCOUNTER — Telehealth: Payer: Self-pay | Admitting: Physical Therapy

## 2019-04-20 NOTE — Telephone Encounter (Signed)
Called pt to schedule a BP follow-up visit w/ Dr. Earlene Plater.

## 2019-04-21 NOTE — Telephone Encounter (Signed)
L/m to call office for appointment

## 2019-04-24 NOTE — Telephone Encounter (Signed)
Patient called to say that due to the coronavirus he have been out of work for a few weeks now and is not able to come in and see the doctor but say that he would appreciate it if she can send his refills to the pharmacy please. Please call patient for further explanation

## 2019-04-24 NOTE — Telephone Encounter (Signed)
Sorry, it has been several months over a year now. Refill denied.

## 2019-04-24 NOTE — Telephone Encounter (Signed)
See note

## 2019-04-24 NOTE — Telephone Encounter (Signed)
Please advise 

## 2019-04-25 NOTE — Telephone Encounter (Signed)
Noted, also made Dr. Earlene Plater aware. No further action required at this time.

## 2019-04-25 NOTE — Telephone Encounter (Signed)
Patient called back. I spoke with him and explained that he has not been seen since Feb 2019 so he needed a virtual appointment in order to get the mediation refill. He stated that he has hardships will bills and does not have insurance currently and declined to schedule and requested the refill.  I explained that I could provide the number for Patient Financial Assistance [phone] so that he can still get his medication and work out the billing at a later time. He also stated that "he felt different" since being out of his medicine for over a month (no red words).   I attempted multiple times to encourage the patient to schedule a virtual visit to get the medication and to deal with the billing later. I also empathized and suggested on how as a patient myself, I would schedule a visit to get my medication and call [phone] to ask for assistance for the billing portion.  Patient declined all options and stated he understood that he would not get the refill until he has a virtual visit but "was not going to dig a bigger financial hole for himself."  Please contact patient further if needed.

## 2019-04-25 NOTE — Telephone Encounter (Signed)
Called pt and left VM to call the office.  

## 2019-05-09 ENCOUNTER — Encounter: Payer: Self-pay | Admitting: Family Medicine

## 2019-05-10 NOTE — Telephone Encounter (Signed)
Called patient l/m to call office to make app.

## 2019-05-11 NOTE — Progress Notes (Signed)
Virtual Visit via Video   Due to the COVID-19 pandemic, this visit was completed with telemedicine (audio/video) technology to reduce patient and provider exposure as well as to preserve personal protective equipment.   I connected with [name] by a video enabled telemedicine application and verified that I am speaking with the correct person using two identifiers. Location patient: Home Location provider: [facility], Office Persons participating in the virtual visit: [name], Keith RimaErica Latamara Melder, DO Barnie MortJoEllen Buckley, CMA acting as scribe for Dr. Helane RimaErica Samara Buckley.    I discussed the limitations of evaluation and management by telemedicine and the availability of in person appointments. The patient expressed understanding and agreed to proceed.  Care Team   Patient Care Team: [name], Keith Lutze, DO as PCP - General (Family Medicine)  Subjective:   HPI: Patient was told to follow up in office for medications refills. We have not seen in over a year. He has been out of medications for over two months.  Hypertension: Has been out of medications for a few months.   Review: not taking medications regularly as instructed, no medication side effects noted, no TIAs, no chest pain on exertion, no dyspnea on exertion, no swelling of ankles. Smoker: No.   BP Readings from Last 3 Encounters:  05/12/19 138/86  02/16/18 140/80  03/31/17 (!) 146/87   Lab Results  Component Value Date   CREATININE 0.88 02/16/2018   CREATININE 0.99 02/04/2017   CREATININE 0.82 01/25/2015     Review of Systems  Constitutional: Negative for chills and fever.  HENT: Negative for hearing loss and tinnitus.   Eyes: Negative for blurred vision, double vision and photophobia.  Respiratory: Negative for cough and hemoptysis.   Cardiovascular: Negative for chest pain, palpitations and orthopnea.  Gastrointestinal: Negative for heartburn and nausea.  Genitourinary: Negative for dysuria and urgency.    Musculoskeletal: Negative for myalgias.  Skin: Negative for rash.  Neurological: Negative for dizziness and headaches.  Endo/Heme/Allergies: Does not bruise/bleed easily.  Psychiatric/Behavioral: Negative for depression and suicidal ideas.    Patient Active Problem List   Diagnosis Date Noted  . Epidermoid cyst 02/19/2018  . Sleep-disordered breathing 02/19/2018  . NAFL (nonalcoholic fatty liver) 02/04/2017  . Obesity (BMI 30.0-34.9) 02/04/2017  . Kidney stone 07/01/2011  . HTN (hypertension) 07/01/2011  . Chronic seasonal allergic rhinitis due to pollen 07/01/2011  . GERD (gastroesophageal reflux disease) 07/01/2011  . HLD (hyperlipidemia) 07/01/2011    Social History   Tobacco Use  . Smoking status: Never Smoker  . Smokeless tobacco: Never Used  Substance Use Topics  . Alcohol use: No   Current Outpatient Medications:  .  amLODipine (NORVASC) 10 MG tablet, Take 1 tablet (10 mg total) by mouth daily., Disp: 90 tablet, Rfl: 2 .  fluticasone (FLONASE) 50 MCG/ACT nasal spray, Place 2 sprays into both nostrils daily., Disp: 16 g, Rfl: 6 .  ibuprofen (ADVIL,MOTRIN) 200 MG tablet, Take 400 mg by mouth every 6 (six) hours as needed for moderate pain., Disp: , Rfl:   Allergies  Allergen Reactions  . Pollen Extract Other (See Comments)    Sneezing.  . Zocor [Simvastatin] Other (See Comments)    Stomach Cramps     Objective:   VITALS: Per patient if applicable, see vitals. GENERAL: Alert, appears well and in no acute distress. HEENT: Atraumatic, conjunctiva clear, no obvious abnormalities on inspection of external nose and ears. NECK: Normal movements of the head and neck. CARDIOPULMONARY: No increased WOB. Speaking in clear sentences. I:E ratio  WNL.  MS: Moves all visible extremities without noticeable abnormality. PSYCH: Pleasant and cooperative, well-groomed. Speech normal rate and rhythm. Affect is appropriate. Insight and judgement are appropriate. Attention is focused,  linear, and appropriate.  NEURO: CN grossly intact. Oriented as arrived to appointment on time with no prompting. Moves both UE equally.  SKIN: No obvious lesions, wounds, erythema, or cyanosis noted on face or hands.  Depression screen [facility] 2/9 05/12/2019 02/06/2017 02/04/2017  Decreased Interest 0 0 0  Down, Depressed, Hopeless 0 0 0  PHQ - 2 Score 0 0 0  Altered sleeping 0 - -  Tired, decreased energy 0 - -  Change in appetite 0 - -  Feeling bad or failure about yourself  0 - -  Trouble concentrating 0 - -  Moving slowly or fidgety/restless 0 - -  Suicidal thoughts 0 - -  PHQ-9 Score 0 - -  Difficult doing work/chores Not difficult at all - -    Assessment and Plan:   [name] was seen today for medication refill.  Diagnoses and all orders for this visit:  Essential hypertension -     amLODipine (NORVASC) 10 MG tablet; Take 1 tablet (10 mg total) by mouth daily.  Pure hypercholesterolemia -     rosuvastatin (CRESTOR) 5 MG tablet; Take 1 tablet (5 mg total) by mouth daily. -     Comprehensive metabolic panel; Future -     Lipid panel; Future  NAFL (nonalcoholic fatty liver)  Discussed the importance of labs and CPE. He will call for future visit.   Marland Kitchen COVID-19 Education: The signs and symptoms of COVID-19 were discussed with the patient and how to seek care for testing if needed. The importance of social distancing was discussed today. . Reviewed expectations re: course of current medical issues. . Discussed self-management of symptoms. . Outlined signs and symptoms indicating need for more acute intervention. . Patient verbalized understanding and all questions were answered. Marland Kitchen Health Maintenance issues including appropriate healthy diet, exercise, and smoking avoidance were discussed with patient. . See orders for this visit as documented in the electronic medical record.  Keith Rima, DO  Records requested if needed. Time spent: 25 minutes, of which >50% was spent in  obtaining information about his symptoms, reviewing his previous labs, evaluations, and treatments, counseling him about his condition (please see the discussed topics above), and developing a plan to further investigate it; he had a number of questions which I addressed.

## 2019-05-12 ENCOUNTER — Encounter: Payer: Self-pay | Admitting: Family Medicine

## 2019-05-12 ENCOUNTER — Other Ambulatory Visit: Payer: Self-pay

## 2019-05-12 ENCOUNTER — Ambulatory Visit (INDEPENDENT_AMBULATORY_CARE_PROVIDER_SITE_OTHER): Payer: Self-pay | Admitting: Family Medicine

## 2019-05-12 VITALS — BP 138/86 | Ht 70.0 in | Wt 218.0 lb

## 2019-05-12 DIAGNOSIS — E7800 Pure hypercholesterolemia, unspecified: Secondary | ICD-10-CM

## 2019-05-12 DIAGNOSIS — I10 Essential (primary) hypertension: Secondary | ICD-10-CM

## 2019-05-12 DIAGNOSIS — K760 Fatty (change of) liver, not elsewhere classified: Secondary | ICD-10-CM

## 2019-05-12 MED ORDER — ROSUVASTATIN CALCIUM 5 MG PO TABS: 5.0000 mg | ORAL_TABLET | Freq: Every day | ORAL | 3 refills | Status: DC

## 2019-05-12 MED ORDER — AMLODIPINE BESYLATE 10 MG PO TABS: 10.0000 mg | ORAL_TABLET | Freq: Every day | ORAL | 2 refills | Status: DC

## 2020-05-06 ENCOUNTER — Encounter (INDEPENDENT_AMBULATORY_CARE_PROVIDER_SITE_OTHER): Payer: Self-pay | Admitting: Family Medicine

## 2020-05-07 NOTE — Telephone Encounter (Signed)
Please review

## 2020-06-13 ENCOUNTER — Encounter (INDEPENDENT_AMBULATORY_CARE_PROVIDER_SITE_OTHER): Payer: Self-pay | Admitting: Family Medicine

## 2020-06-13 NOTE — Telephone Encounter (Signed)
Please review

## 2020-06-20 NOTE — Telephone Encounter (Signed)
LVM asking patient to give us a call back.  

## 2020-07-04 ENCOUNTER — Other Ambulatory Visit: Payer: Self-pay

## 2020-07-04 ENCOUNTER — Telehealth: Payer: Self-pay | Admitting: Family Medicine

## 2020-07-04 DIAGNOSIS — I10 Essential (primary) hypertension: Secondary | ICD-10-CM

## 2020-07-04 MED ORDER — AMLODIPINE BESYLATE 10 MG PO TABS: 10.0000 mg | ORAL_TABLET | Freq: Every day | ORAL | 0 refills | Status: DC

## 2020-07-04 NOTE — Telephone Encounter (Signed)
MEDICATION: amLODipine (NORVASC) 10 MG tablet  PHARMACY:   [facility] [address], [place] - [address] Phone:  [phone]  Fax:  [phone]       Comments: Patient is scheduled for a TOC on 07/25/20   **Let patient know to contact pharmacy at the end of the day to make sure medication is ready. **  ** Please notify patient to allow 48-72 hours to process**  **Encourage patient to contact the pharmacy for refills or they can request refills through [facility]**

## 2020-07-25 ENCOUNTER — Encounter: Payer: Self-pay | Admitting: Family Medicine

## 2020-07-25 ENCOUNTER — Telehealth (INDEPENDENT_AMBULATORY_CARE_PROVIDER_SITE_OTHER): Payer: Self-pay | Admitting: Family Medicine

## 2020-07-25 DIAGNOSIS — R739 Hyperglycemia, unspecified: Secondary | ICD-10-CM

## 2020-07-25 DIAGNOSIS — E7800 Pure hypercholesterolemia, unspecified: Secondary | ICD-10-CM

## 2020-07-25 DIAGNOSIS — I10 Essential (primary) hypertension: Secondary | ICD-10-CM

## 2020-07-25 MED ORDER — AMLODIPINE BESYLATE 10 MG PO TABS: 10.0000 mg | ORAL_TABLET | Freq: Every day | ORAL | 3 refills | Status: DC

## 2020-07-25 NOTE — Progress Notes (Signed)
   [name] is a 54 y.o. male who presents today for a virtual office visit.  He is transferring care.   Assessment/Plan:  Chronic Problems Addressed Today: Hyperglycemia Check A1c next blood draw. Continue working on diet and exercise.   HLD (hyperlipidemia) Did not tolerate statins. Check lipids next blood draw.   HTN (hypertension) Doing well back on amlodipine. Will send in refills for amlodipine 10mg  daily.   Advised patient to come back soon for CPE.     Subjective:  HPI:  See A/p.         Objective/Observations  Physical Exam: Gen: NAD, resting comfortably Pulm: Normal work of breathing Neuro: Grossly normal, moves all extremities Psych: Normal affect and thought content  Virtual Visit via Video   I connected with on 07/25/20 at 10:40 AM EDT by a video enabled telemedicine application and verified that I am speaking with the correct person using two identifiers. The limitations of evaluation and management by telemedicine and the availability of in person appointments were discussed. The patient expressed understanding and agreed to proceed.   Patient location: Home Provider location: [facility] 09/24/20 Persons participating in the virtual visit: Myself and Patient     [employer]. Katina Degree, MD 07/25/2020 11:07 AM

## 2020-07-25 NOTE — Assessment & Plan Note (Signed)
Did not tolerate statins. Check lipids next blood draw.

## 2020-07-25 NOTE — Assessment & Plan Note (Signed)
Check A1c next blood draw. Continue working on diet and exercise.

## 2020-07-25 NOTE — Assessment & Plan Note (Signed)
Doing well back on amlodipine. Will send in refills for amlodipine 10mg  daily.

## 2021-04-27 ENCOUNTER — Telehealth: Payer: Self-pay | Admitting: Family

## 2021-04-27 ENCOUNTER — Encounter: Payer: Self-pay | Admitting: Family

## 2021-04-27 DIAGNOSIS — R1031 Right lower quadrant pain: Secondary | ICD-10-CM

## 2021-04-27 NOTE — Addendum Note (Signed)
Addended by: Jannifer Rodney A on: 04/27/2021 12:48 PM   Modules accepted: Level of Service

## 2021-04-27 NOTE — Progress Notes (Signed)
   Virtual Visit  Note Due to COVID-19 pandemic this visit was conducted virtually. This visit type was conducted due to national recommendations for restrictions regarding the COVID-19 Pandemic (e.g. social distancing, sheltering in place) in an effort to limit this patient's exposure and mitigate transmission in our community. All issues noted in this document were discussed and addressed.  A physical exam was not performed with this format.  I connected with [name] on 04/27/21 at 11:59 AM by video and verified that I am speaking with the correct person using two identifiers. [name] is currently located at home and wife  is currently with him  during visit. The provider, Jannifer Rodney, FNP is located in their office at time of visit.  I discussed the limitations, risks, security and privacy concerns of performing an evaluation and management service by video and the availability of in person appointments. I also discussed with the patient that there may be a patient responsible charge related to this service. The patient expressed understanding and agreed to proceed.   History and Present Illness:  Pt presents today with right lower abdominal pain that has been on and off over the last two months. He reports when he eats diary it is worse. Reports it feels like a "gas".  Abdominal Pain This is a new problem. The current episode started more than 1 month ago. The problem occurs intermittently. The problem has been waxing and waning. The pain is located in the RLQ. The pain is at a severity of 5/10. The pain is mild. The quality of the pain is burning. The abdominal pain radiates to the RLQ. Associated symptoms include belching, diarrhea and flatus. Pertinent negatives include no constipation, dysuria, hematochezia, hematuria, nausea, vomiting or weight loss.       Review of Systems  Constitutional: Negative for weight loss.  Gastrointestinal: Positive for abdominal pain, diarrhea and  flatus. Negative for constipation, hematochezia, nausea and vomiting.  Genitourinary: Negative for dysuria and hematuria.     Observations/Objective: No SOB or distress noted, no distress noted when pushing on RLQ  Assessment and Plan: 1. Right lower quadrant abdominal pain Recommend follow up with PCP this week. I also encouraged a GI referral as he has never had colonoscopy  Avoid diary at this time Red flags discussed such as increase pain or fever to go to ED      I discussed the assessment and treatment plan with the patient. The patient was provided an opportunity to ask questions and all were answered. The patient agreed with the plan and demonstrated an understanding of the instructions.   The patient was advised to call back or seek an in-person evaluation if the symptoms worsen or if the condition fails to improve as anticipated.  The above assessment and management plan was discussed with the patient. The patient verbalized understanding of and has agreed to the management plan. Patient is aware to call the clinic if symptoms persist or worsen. Patient is aware when to return to the clinic for a follow-up visit. Patient educated on when it is appropriate to go to the emergency department.   Time call ended:  12:15 pm   I provided 16 minutes of   face-to-face time during this encounter.    Jannifer Rodney, FNP

## 2021-07-11 ENCOUNTER — Telehealth: Payer: Self-pay | Admitting: Emergency Medicine

## 2021-07-11 DIAGNOSIS — R519 Headache, unspecified: Secondary | ICD-10-CM

## 2021-07-11 MED ORDER — METOCLOPRAMIDE HCL 10 MG PO TABS: 10.0000 mg | ORAL_TABLET | Freq: Three times a day (TID) | ORAL | 0 refills | Status: DC | PRN

## 2021-07-11 NOTE — Progress Notes (Signed)
Virtual Visit Consent   [name], you are scheduled for a virtual visit with a [facility] provider today.     Just as with appointments in the office, your consent must be obtained to participate.  Your consent will be active for this visit and any virtual visit you may have with one of our providers in the next 365 days.     If you have a MyChart account, a copy of this consent can be sent to you electronically.  All virtual visits are billed to your insurance company just like a traditional visit in the office.    As this is a virtual visit, video technology does not allow for your provider to perform a traditional examination.  This may limit your provider's ability to fully assess your condition.  If your provider identifies any concerns that need to be evaluated in person or the need to arrange testing (such as labs, EKG, etc.), we will make arrangements to do so.     Although advances in technology are sophisticated, we cannot ensure that it will always work on either your end or our end.  If the connection with a video visit is poor, the visit may have to be switched to a telephone visit.  With either a video or telephone visit, we are not always able to ensure that we have a secure connection.     I need to obtain your verbal consent now.   Are you willing to proceed with your visit today?    [name] has provided verbal consent on 07/11/2021 for a virtual visit video.   Roxy Horseman, PA-C   Date: 07/11/2021 2:41 PM   Virtual Visit via Video Note   I, Roxy Horseman, connected with  [name]  ([id number], 1966/05/13) on 07/11/21 at  2:45 PM EDT by a video-enabled telemedicine application and verified that I am speaking with the correct person using two identifiers.  Location: Patient: Virtual Visit Location Patient: Home Provider: Virtual Visit Location Provider: Home Office   I discussed the limitations of evaluation and management by telemedicine and the  availability of in person appointments. The patient expressed understanding and agreed to proceed.    History of Present Illness: [name] is a 55 y.o. who identifies as a male who was assigned male at birth, and is being seen today for headache.  Reports persist headache for the past 3-5 days.  Has had little improvement with excedrin.  Denies fevers, chills, loss of vision, double vision difficulty speaking, numbness, weakness, or tingling.  Denies hx of migraines.  HPI: HPI  Problems:  Patient Active Problem List   Diagnosis Date Noted   Hyperglycemia 07/25/2020   NAFL (nonalcoholic fatty liver) 02/04/2017   HTN (hypertension) 07/01/2011   Chronic seasonal allergic rhinitis due to pollen 07/01/2011   GERD (gastroesophageal reflux disease) 07/01/2011   HLD (hyperlipidemia) 07/01/2011    Allergies:  Allergies  Allergen Reactions   Pollen Extract Other (See Comments)    Sneezing.   Zocor [Simvastatin] Other (See Comments)    Stomach Cramps    Medications:  Current Outpatient Medications:    amLODipine (NORVASC) 10 MG tablet, Take 1 tablet (10 mg total) by mouth daily., Disp: 90 tablet, Rfl: 3  Observations/Objective: Patient is well-developed, well-nourished in no acute distress.  Resting comfortably  at home.  Head is normocephalic, atraumatic.  No labored breathing.  Speech is clear and coherent with logical content.  Patient is alert and oriented at baseline.  Assessment and Plan: 1. Nonintractable headache, unspecified chronicity pattern, unspecified headache type - Try home headache cocktail of ibuprofen, benadryl, and reglan. - Strongly advise in-person visit at ER, but patient declined at this time, but will go if no improvement with treatment above.  Follow Up Instructions: I discussed the assessment and treatment plan with the patient. The patient was provided an opportunity to ask questions and all were answered. The patient agreed with the plan and  demonstrated an understanding of the instructions.  A copy of instructions were sent to the patient via MyChart.  The patient was advised to call back or seek an in-person evaluation if the symptoms worsen or if the condition fails to improve as anticipated.  Time:  I spent 12 minutes with the patient via telehealth technology discussing the above problems/concerns.    Roxy Horseman, PA-C

## 2021-07-11 NOTE — Patient Instructions (Signed)
Take the following for headache: 800mg  Ibuprofen 50mg  Benadryl 10mg  Reglan Take all 3 together with a couple of large glasses of water and try and take a nap.  If this doesn't fix your headache, you should be seen in-person as we discussed.   , thank you for joining , PA-C for today's virtual visit.  While this provider is not your primary care provider (PCP), if your PCP is located in our provider database this encounter information will be shared with them immediately following your visit.  Consent: (Patient) Sherrill Mckamie provided verbal consent for this virtual visit at the beginning of the encounter.  Current Medications:  Current Outpatient Medications:    metoCLOPramide (REGLAN) 10 MG tablet, Take 1 tablet (10 mg total) by mouth every 8 (eight) hours as needed for up to 3 doses for nausea., Disp: 3 tablet, Rfl: 0   amLODipine (NORVASC) 10 MG tablet, Take 1 tablet (10 mg total) by mouth daily., Disp: 90 tablet, Rfl: 3   Medications ordered in this encounter:  Meds ordered this encounter  Medications   metoCLOPramide (REGLAN) 10 MG tablet    Sig: Take 1 tablet (10 mg total) by mouth every 8 (eight) hours as needed for up to 3 doses for nausea.    Dispense:  3 tablet    Refill:  0    Order Specific Question:   Supervising Provider    Answer:   [name], BRIAN [[id number]]     *If you need refills on other medications prior to your next appointment, please contact your pharmacy*  Follow-Up: Call back or seek an in-person evaluation if the symptoms worsen or if the condition fails to improve as anticipated.    If you have been instructed to have an in-person evaluation today at a local Urgent Care facility, please use the link below. It will take you to a list of all of our available [facility], including address, phone number and hours of operation. Please do not delay care.  [facility]  If you or a family member do not have a  primary care provider, use the link below to schedule a visit and establish care. When you choose a [facility] primary care physician or advanced practice provider, you gain a long-term partner in health. Find a Primary Care Provider  Learn more about [facility]'s in-office and virtual care options:  - Get Care Now

## 2021-12-11 ENCOUNTER — Telehealth: Payer: Self-pay | Admitting: Physician Assistant

## 2021-12-11 DIAGNOSIS — L729 Follicular cyst of the skin and subcutaneous tissue, unspecified: Secondary | ICD-10-CM

## 2021-12-11 DIAGNOSIS — K644 Residual hemorrhoidal skin tags: Secondary | ICD-10-CM

## 2021-12-11 DIAGNOSIS — L089 Local infection of the skin and subcutaneous tissue, unspecified: Secondary | ICD-10-CM

## 2021-12-11 MED ORDER — PRAMOXINE-HC 1-1 % EX LOTN: TOPICAL_LOTION | Freq: Three times a day (TID) | CUTANEOUS | 0 refills | Status: DC

## 2021-12-11 MED ORDER — DOXYCYCLINE HYCLATE 100 MG PO TABS: 100.0000 mg | ORAL_TABLET | Freq: Two times a day (BID) | ORAL | 0 refills | Status: DC

## 2021-12-11 NOTE — Patient Instructions (Signed)
°  [name], thank you for joining [name], PA-C for today's virtual visit.  While this provider is not your primary care provider (PCP), if your PCP is located in our provider database this encounter information will be shared with them immediately following your visit.  Consent: (Patient) [name] provided verbal consent for this virtual visit at the beginning of the encounter.  Current Medications:  Current Outpatient Medications:    amLODipine (NORVASC) 10 MG tablet, Take 1 tablet (10 mg total) by mouth daily., Disp: 90 tablet, Rfl: 3   metoCLOPramide (REGLAN) 10 MG tablet, Take 1 tablet (10 mg total) by mouth every 8 (eight) hours as needed for up to 3 doses for nausea., Disp: 3 tablet, Rfl: 0   Medications ordered in this encounter:  No orders of the defined types were placed in this encounter.    *If you need refills on other medications prior to your next appointment, please contact your pharmacy*  Follow-Up: Call back or seek an in-person evaluation if the symptoms worsen or if the condition fails to improve as anticipated.  Other Instructions Please take the antibiotic as directed. Apply warm compresses to the cyst for about 10-15 minutes, a few times per day. You can use Tylenol or Ibuprofen if needed for pain. If not resolving, or any new/worsening symptoms, you need an in-person evaluation.  For the hemorrhoid, increase fluid intake and fiber intake. Start a daily probiotic OTC.  You may want to consider a stool softener as needed for the next week. Use the prescription cream as directed. This should help the area shrink and reduce/resolve pain.  If not improving, or worsening, you need to be evaluated in-person ASAP. Do not delay care!   If you have been instructed to have an in-person evaluation today at a local Urgent Care facility, please use the link below. It will take you to a list of all of our available [facility], including  address, phone number and hours of operation. Please do not delay care.  [facility]  If you or a family member do not have a primary care provider, use the link below to schedule a visit and establish care. When you choose a [facility] primary care physician or advanced practice provider, you gain a long-term partner in health. Find a Primary Care Provider  Learn more about [facility]'s in-office and virtual care options: [facility] - Get Care Now

## 2021-12-11 NOTE — Progress Notes (Signed)
Virtual Visit Consent   [name], you are scheduled for a virtual visit with a [facility] provider today.     Just as with appointments in the office, your consent must be obtained to participate.  Your consent will be active for this visit and any virtual visit you may have with one of our providers in the next 365 days.     If you have a MyChart account, a copy of this consent can be sent to you electronically.  All virtual visits are billed to your insurance company just like a traditional visit in the office.    As this is a virtual visit, video technology does not allow for your provider to perform a traditional examination.  This may limit your provider's ability to fully assess your condition.  If your provider identifies any concerns that need to be evaluated in person or the need to arrange testing (such as labs, EKG, etc.), we will make arrangements to do so.     Although advances in technology are sophisticated, we cannot ensure that it will always work on either your end or our end.  If the connection with a video visit is poor, the visit may have to be switched to a telephone visit.  With either a video or telephone visit, we are not always able to ensure that we have a secure connection.     I need to obtain your verbal consent now.   Are you willing to proceed with your visit today?    [name] has provided verbal consent on 12/11/2021 for a virtual visit (video or telephone).   [name], [place]   Date: 12/11/2021 4:08 PM   Virtual Visit via Video Note   I, [name], connected with  [name]  ([id number], Mar 02, 1966) on 12/11/21 at  4:00 PM EST by a video-enabled telemedicine application and verified that I am speaking with the correct person using two identifiers.  Location: Patient: Virtual Visit Location Patient: Home Provider: Virtual Visit Location Provider: Home Office   I discussed the limitations of evaluation and management by  telemedicine and the availability of in person appointments. The patient expressed understanding and agreed to proceed.    History of Present Illness: [name] is a 55 y.o. who identifies as a male who was assigned male at birth, and is being seen today for multiple complaints. .  Notes a cyst of thoracic back that has become red, angry and tender with some pustular drainage initially. Now firmer and tender without drainage. Denies fever, chills, malaise.   Patient also notes an intermittent issue with hemorrhoids over the past couple of years. Has been doing well overall up until recently when he started noting external hemorrhoid with some mild discomfort but also some occasional bright red blood per rectum.   HPI: HPI  Problems:  Patient Active Problem List   Diagnosis Date Noted   Hyperglycemia 07/25/2020   NAFL (nonalcoholic fatty liver) 02/04/2017   HTN (hypertension) 07/01/2011   Chronic seasonal allergic rhinitis due to pollen 07/01/2011   GERD (gastroesophageal reflux disease) 07/01/2011   HLD (hyperlipidemia) 07/01/2011    Allergies:  Allergies  Allergen Reactions   Pollen Extract Other (See Comments)    Sneezing.   Zocor [Simvastatin] Other (See Comments)    Stomach Cramps    Medications:  Current Outpatient Medications:    doxycycline (VIBRA-TABS) 100 MG tablet, Take 1 tablet (100 mg total) by mouth 2 (two) times daily., Disp: 14 tablet,  Rfl: 0   pramoxine-hydrocortisone 1-1 % lotion, Apply topically 3 (three) times daily., Disp: 59 mL, Rfl: 0   amLODipine (NORVASC) 10 MG tablet, Take 1 tablet (10 mg total) by mouth daily., Disp: 90 tablet, Rfl: 3  Observations/Objective: Patient is well-developed, well-nourished in no acute distress.  Resting comfortably at home.  Head is normocephalic, atraumatic.  No labored breathing. Speech is clear and coherent with logical content.  Patient is alert and oriented at baseline.   Assessment and Plan: 1. Infected cyst of  skin - doxycycline (VIBRA-TABS) 100 MG tablet; Take 1 tablet (100 mg total) by mouth 2 (two) times daily.  Dispense: 14 tablet; Refill: 0  Supportive measures reviewed. Will stat Doxycycline 100 mg BID x 7 days.   2. External hemorrhoid - pramoxine-hydrocortisone 1-1 % lotion; Apply topically 3 (three) times daily.  Dispense: 59 mL; Refill: 0  No alarm signs/symptoms. Bowel regimen reviewed. Proper cleaning of the area discussed. Start Pramoxine-HC lotion to the area three times daily for the next week. Discussed need for in-person assessment if not resolving, or any new or worsening symptoms.   Follow Up Instructions: I discussed the assessment and treatment plan with the patient. The patient was provided an opportunity to ask questions and all were answered. The patient agreed with the plan and demonstrated an understanding of the instructions.  A copy of instructions were sent to the patient via MyChart unless otherwise noted below.   The patient was advised to call back or seek an in-person evaluation if the symptoms worsen or if the condition fails to improve as anticipated.  Time:  I spent 12 minutes with the patient via telehealth technology discussing the above problems/concerns.    [name], PA-C

## 2021-12-13 ENCOUNTER — Encounter: Payer: Self-pay | Admitting: Family Medicine

## 2021-12-17 NOTE — Telephone Encounter (Signed)
Please see message from pt. Had a virtual visit with you on 12/22.

## 2021-12-18 MED ORDER — CEPHALEXIN 500 MG PO CAPS: 500.0000 mg | ORAL_CAPSULE | Freq: Three times a day (TID) | ORAL | 0 refills | Status: AC

## 2022-04-17 ENCOUNTER — Telehealth: Payer: Self-pay | Admitting: Emergency Medicine

## 2022-04-17 DIAGNOSIS — I10 Essential (primary) hypertension: Secondary | ICD-10-CM

## 2022-04-17 DIAGNOSIS — J020 Streptococcal pharyngitis: Secondary | ICD-10-CM

## 2022-04-17 MED ORDER — AMOXICILLIN 500 MG PO CAPS: 500.0000 mg | ORAL_CAPSULE | Freq: Two times a day (BID) | ORAL | 0 refills | Status: AC

## 2022-04-17 MED ORDER — AMLODIPINE BESYLATE 10 MG PO TABS: 10.0000 mg | ORAL_TABLET | Freq: Every day | ORAL | 0 refills | Status: DC

## 2022-04-17 NOTE — Progress Notes (Signed)
?Virtual Visit Consent  ? ?[name], you are scheduled for a virtual visit with a [facility] provider today.   ?  ?Just as with appointments in the office, your consent must be obtained to participate.  Your consent will be active for this visit and any virtual visit you may have with one of our providers in the next 365 days.   ?  ?If you have a MyChart account, a copy of this consent can be sent to you electronically.  All virtual visits are billed to your insurance company just like a traditional visit in the office.   ? ?As this is a virtual visit, video technology does not allow for your provider to perform a traditional examination.  This may limit your provider's ability to fully assess your condition.  If your provider identifies any concerns that need to be evaluated in person or the need to arrange testing (such as labs, EKG, etc.), we will make arrangements to do so.   ?  ?Although advances in technology are sophisticated, we cannot ensure that it will always work on either your end or our end.  If the connection with a video visit is poor, the visit may have to be switched to a telephone visit.  With either a video or telephone visit, we are not always able to ensure that we have a secure connection.    ? ?Also, by engaging in this virtual visit, you consent to the provision of healthcare. Additionally, you authorize for your insurance to be billed (if applicable) for the services provided during this visit.  ? ?I need to obtain your verbal consent now.   Are you willing to proceed with your visit today?  ? ?  ?[name] has provided verbal consent on 04/17/2022 for a virtual visit (video or telephone). ?  ?Carvel Getting, NP  ? ?Date: 04/17/2022 8:18 AM ? ? ?Virtual Visit via Video Note  ? ?[name], connected with  [name]  ([id number], 08-22-1966) on 04/17/22 at  8:00 AM EDT by a video-enabled telemedicine application and verified that I am speaking with the correct person using two  identifiers. ? ?Location: ?Patient: Virtual Visit Location Patient: Home ?Provider: Virtual Visit Location Provider: Home Office ?  ?I discussed the limitations of evaluation and management by telemedicine and the availability of in person appointments. The patient expressed understanding and agreed to proceed.   ? ?History of Present Illness: ?[name] is a 56 y.o. who identifies as a male who was assigned male at birth, and is being seen today for sore throat. Sx began 3 days ago with sore throat and fever. Fever resolved after 1 day but sore throat has gotten progressively worse. Reports mild nasal congestion and headache and denies cough, sob or wheezing. Has been taking iburpfoen for pain, last dose yesterday. Throat pain is severe, hurts to even drink water or swallow saliva. Has looked in throat with a mirror and has white patches on his L tonsil. Reports B submandibular tenderness.  ? ?Also requests refill of amlodipdine for HTN. Has been taking this for years. BP at home is around 125/65 when he checks it. Does not have PCP right now because he doesn't have insurance and cant' afford it. Review of EPic records shows he last had renal function checked in 2019.  ? ? ?HPI: HPI  ?Problems:  ?Patient Active Problem List  ? Diagnosis Date Noted  ? Hyperglycemia 07/25/2020  ? NAFL (nonalcoholic fatty liver) [id number]  ?  HTN (hypertension) 07/01/2011  ? Chronic seasonal allergic rhinitis due to pollen 07/01/2011  ? GERD (gastroesophageal reflux disease) 07/01/2011  ? HLD (hyperlipidemia) 07/01/2011  ?  ?Allergies:  ?Allergies  ?Allergen Reactions  ? Doxycycline Swelling  ? Pollen Extract Other (See Comments)  ?  Sneezing.  ? Zocor [Simvastatin] Other (See Comments)  ?  Stomach Cramps ?  ? ?Medications:  ?Current Outpatient Medications:  ?  amoxicillin (AMOXIL) 500 MG capsule, Take 1 capsule (500 mg total) by mouth 2 (two) times daily for 10 days., Disp: 20 capsule, Rfl: 0 ?  amLODipine (NORVASC) 10 MG tablet,  Take 1 tablet (10 mg total) by mouth daily., Disp: 90 tablet, Rfl: 0 ? ?Observations/Objective: ?Patient is well-developed, well-nourished in no acute distress.  ?Resting comfortably  at home.  ?Head is normocephalic, atraumatic.  ?No labored breathing.  ?Speech is clear and coherent with logical content.  ?Patient is alert and oriented at baseline.  ? ? ?Assessment and Plan: ?1. Strep pharyngitis ? ?2. Essential hypertension ?- amLODipine (NORVASC) 10 MG tablet; Take 1 tablet (10 mg total) by mouth daily.  Dispense: 90 tablet; Refill: 0 ? ?Discussed with pt options for self pay care including contacting the [facility] care network for assistance if he is low income. Discussed need to engage with pcp, get labs periodically. Refilled amlodipine for 3 months only while pt works on connecting to care.  ? ?Likely has strep throat despite nasal congestion as I am seeing lots of people recntly who test posiive for strep despite nasal drainage sx. D\oscissed with pt if it's not strep, abx won't help his sx. Reviewed supportie care measures.  ? ?Follow Up Instructions: ?I discussed the assessment and treatment plan with the patient. The patient was provided an opportunity to ask questions and all were answered. The patient agreed with the plan and demonstrated an understanding of the instructions.  A copy of instructions were sent to the patient via MyChart unless otherwise noted below.  ? ?The patient was advised to call back or seek an in-person evaluation if the symptoms worsen or if the condition fails to improve as anticipated. ? ?Time:  ?I spent 15 minutes with the patient via telehealth technology discussing the above problems/concerns.   ? ?Carvel Getting, NP ?

## 2022-04-17 NOTE — Patient Instructions (Signed)
?  Sula Soda, thank you for joining Carvel Getting, NP for today's virtual visit.  While this provider is not your primary care provider (PCP), if your PCP is located in our provider database this encounter information will be shared with them immediately following your visit. ? ?Consent: ?(Patient) [name] provided verbal consent for this virtual visit at the beginning of the encounter. ? ?Current Medications: ? ?Current Outpatient Medications:  ?  amoxicillin (AMOXIL) 500 MG capsule, Take 1 capsule (500 mg total) by mouth 2 (two) times daily for 10 days., Disp: 20 capsule, Rfl: 0 ?  amLODipine (NORVASC) 10 MG tablet, Take 1 tablet (10 mg total) by mouth daily., Disp: 90 tablet, Rfl: 0  ? ?Medications ordered in this encounter:  ?Meds ordered this encounter  ?Medications  ? amoxicillin (AMOXIL) 500 MG capsule  ?  Sig: Take 1 capsule (500 mg total) by mouth 2 (two) times daily for 10 days.  ?  Dispense:  20 capsule  ?  Refill:  0  ? amLODipine (NORVASC) 10 MG tablet  ?  Sig: Take 1 tablet (10 mg total) by mouth daily.  ?  Dispense:  90 tablet  ?  Refill:  0  ?  ? ?*If you need refills on other medications prior to your next appointment, please contact your pharmacy* ? ?Follow-Up: ?Call back or seek an in-person evaluation if the symptoms worsen or if the condition fails to improve as anticipated. ? ?Other Instructions ?Please connect with a primary care provider for your blood pressure care. If you are low income, you can try the [facility] for help.  ? ?Finish all of the antibiotics, even if you are feeling better.  ? ? ?If you have been instructed to have an in-person evaluation today at a local Urgent Care facility, please use the link below. It will take you to a list of all of our available [facility], including address, phone number and hours of operation. Please do not delay care.  ?[facility] ? ?If you or a family member do not have a primary care  provider, use the link below to schedule a visit and establish care. When you choose a [facility] primary care physician or advanced practice provider, you gain a long-term partner in health. ?Find a Primary Care Provider ? ?Learn more about [facility]'s in-office and virtual care options: ?[facility] Now  ?

## 2022-07-29 ENCOUNTER — Encounter (INDEPENDENT_AMBULATORY_CARE_PROVIDER_SITE_OTHER): Payer: Self-pay

## 2022-10-04 ENCOUNTER — Telehealth: Payer: Self-pay | Admitting: Nurse Practitioner

## 2022-10-04 ENCOUNTER — Encounter: Payer: Self-pay | Admitting: Nurse Practitioner

## 2022-10-04 DIAGNOSIS — R42 Dizziness and giddiness: Secondary | ICD-10-CM

## 2022-10-04 MED ORDER — MECLIZINE HCL 25 MG PO TABS: 25.0000 mg | ORAL_TABLET | Freq: Three times a day (TID) | ORAL | 0 refills | Status: DC | PRN

## 2022-10-04 NOTE — Patient Instructions (Signed)
  Sula Soda, thank you for joining Gildardo Pounds, NP for today's virtual visit.  While this provider is not your primary care provider (PCP), if your PCP is located in our provider database this encounter information will be shared with them immediately following your visit.  [facility] account gives you access to today's visit and all your visits, tests, and labs performed at [facility] " click here if you don't have a [facility] account or go to [email or web address]  Consent: (Patient) [name] provided verbal consent for this virtual visit at the beginning of the encounter.  Current Medications:  Current Outpatient Medications:    meclizine (ANTIVERT) 25 MG tablet, Take 1 tablet (25 mg total) by mouth 3 (three) times daily as needed for dizziness., Disp: 30 tablet, Rfl: 0   amLODipine (NORVASC) 10 MG tablet, Take 1 tablet (10 mg total) by mouth daily., Disp: 90 tablet, Rfl: 0   Medications ordered in this encounter:  Meds ordered this encounter  Medications   meclizine (ANTIVERT) 25 MG tablet    Sig: Take 1 tablet (25 mg total) by mouth 3 (three) times daily as needed for dizziness.    Dispense:  30 tablet    Refill:  0    Order Specific Question:   Supervising Provider    Answer:   Sabra Heck, BRIAN [[id number]]     *If you need refills on other medications prior to your next appointment, please contact your pharmacy*  Follow-Up: Call back or seek an in-person evaluation if the symptoms worsen or if the condition fails to improve as anticipated.  [facility] [phone]  Other Instructions Follow up in person if symptoms worsen or do not improve with medication   If you have been instructed to have an in-person evaluation today at a local Urgent Care facility, please use the link below. It will take you to a list of all of our available [facility], including address, phone number and hours of operation. Please do  not delay care.  [facility]  If you or a family member do not have a primary care provider, use the link below to schedule a visit and establish care. When you choose a [facility] primary care physician or advanced practice provider, you gain a long-term partner in health. Find a Primary Care Provider  Learn more about [facility]'s in-office and virtual care options: [facility]

## 2022-10-04 NOTE — Progress Notes (Signed)
Virtual Visit Consent   Krystian Younglove, you are scheduled for a virtual visit with a [facility] provider today. Just as with appointments in the office, your consent must be obtained to participate. Your consent will be active for this visit and any virtual visit you may have with one of our providers in the next 365 days. If you have a MyChart account, a copy of this consent can be sent to you electronically.  As this is a virtual visit, video technology does not allow for your provider to perform a traditional examination. This may limit your provider's ability to fully assess your condition. If your provider identifies any concerns that need to be evaluated in person or the need to arrange testing (such as labs, EKG, etc.), we will make arrangements to do so. Although advances in technology are sophisticated, we cannot ensure that it will always work on either your end or our end. If the connection with a video visit is poor, the visit may have to be switched to a telephone visit. With either a video or telephone visit, we are not always able to ensure that we have a secure connection.  By engaging in this virtual visit, you consent to the provision of healthcare and authorize for your insurance to be billed (if applicable) for the services provided during this visit. Depending on your insurance coverage, you may receive a charge related to this service.  I need to obtain your verbal consent now. Are you willing to proceed with your visit today? Jaiden Dinkins has provided verbal consent on 10/04/2022 for a virtual visit (video or telephone). Claiborne Rigg, NP  Date: 10/04/2022 5:25 PM  Virtual Visit via Video Note   I, Claiborne Rigg, connected with  [name]  ([id number], April 11, 1966) on 10/04/22 at  5:00 PM EDT by a video-enabled telemedicine application and verified that I am speaking with the correct person using two identifiers.  Location: Patient: Virtual Visit Location Patient:  Home Provider: Virtual Visit Location Provider: Home Office   I discussed the limitations of evaluation and management by telemedicine and the availability of in person appointments. The patient expressed understanding and agreed to proceed.    History of Present Illness: Christop Hippert is a 56 y.o. who identifies as a male who was assigned male at birth, and is being seen today for vertigo.  Mr Amison endorses onset of dizziness and feeling as if the room was spinning when he woke up Friday morning. Over the past 2 days he states his left ear feels full and whenever he turns his head to left or bends his head down he becomes dizzy. Denies any sudden change in vision or headache or weakness. Associated symptoms include nausea.  States he was treated for this in the past and symptoms resolved however he can not recall the medication he was given. He is taking amlodipine 10 mg daily for his blood pressure and denies any uncontrolled HTN.    Problems:  Patient Active Problem List   Diagnosis Date Noted   Hyperglycemia 07/25/2020   NAFL (nonalcoholic fatty liver) 02/04/2017   HTN (hypertension) 07/01/2011   Chronic seasonal allergic rhinitis due to pollen 07/01/2011   GERD (gastroesophageal reflux disease) 07/01/2011   HLD (hyperlipidemia) 07/01/2011    Allergies:  Allergies  Allergen Reactions   Doxycycline Swelling   Pollen Extract Other (See Comments)    Sneezing.   Zocor [Simvastatin] Other (See Comments)    Stomach Cramps    Medications:  Current Outpatient Medications:    meclizine (ANTIVERT) 25 MG tablet, Take 1 tablet (25 mg total) by mouth 3 (three) times daily as needed for dizziness., Disp: 30 tablet, Rfl: 0   amLODipine (NORVASC) 10 MG tablet, Take 1 tablet (10 mg total) by mouth daily., Disp: 90 tablet, Rfl: 0  Observations/Objective: Patient is well-developed, well-nourished in no acute distress.  Resting comfortably  at home.  Head is normocephalic, atraumatic.  No  labored breathing.  Speech is clear and coherent with logical content.  Patient is alert and oriented at baseline.    Assessment and Plan: 1. Vertigo - meclizine (ANTIVERT) 25 MG tablet; Take 1 tablet (25 mg total) by mouth 3 (three) times daily as needed for dizziness.  Dispense: 30 tablet; Refill: 0   Follow Up Instructions: I discussed the assessment and treatment plan with the patient. The patient was provided an opportunity to ask questions and all were answered. The patient agreed with the plan and demonstrated an understanding of the instructions.  A copy of instructions were sent to the patient via MyChart unless otherwise noted below.   The patient was advised to call back or seek an in-person evaluation if the symptoms worsen or if the condition fails to improve as anticipated.  Time:  I spent 11 minutes with the patient via telehealth technology discussing the above problems/concerns.    Gildardo Pounds, NP

## 2022-11-05 ENCOUNTER — Encounter: Payer: Self-pay | Admitting: Nurse Practitioner

## 2022-11-05 ENCOUNTER — Encounter: Payer: Self-pay | Admitting: Family Medicine

## 2022-11-05 ENCOUNTER — Telehealth: Payer: Self-pay | Admitting: Physician Assistant

## 2022-11-05 DIAGNOSIS — M722 Plantar fascial fibromatosis: Secondary | ICD-10-CM

## 2022-11-05 MED ORDER — MELOXICAM 15 MG PO TABS: 15.0000 mg | ORAL_TABLET | Freq: Every day | ORAL | 0 refills | Status: DC

## 2022-11-05 NOTE — Progress Notes (Signed)
Virtual Visit Consent   [name], you are scheduled for a virtual visit with a [facility] provider today. Just as with appointments in the office, your consent must be obtained to participate. Your consent will be active for this visit and any virtual visit you may have with one of our providers in the next 365 days. If you have a MyChart account, a copy of this consent can be sent to you electronically.  As this is a virtual visit, video technology does not allow for your provider to perform a traditional examination. This may limit your provider's ability to fully assess your condition. If your provider identifies any concerns that need to be evaluated in person or the need to arrange testing (such as labs, EKG, etc.), we will make arrangements to do so. Although advances in technology are sophisticated, we cannot ensure that it will always work on either your end or our end. If the connection with a video visit is poor, the visit may have to be switched to a telephone visit. With either a video or telephone visit, we are not always able to ensure that we have a secure connection.  By engaging in this virtual visit, you consent to the provision of healthcare and authorize for your insurance to be billed (if applicable) for the services provided during this visit. Depending on your insurance coverage, you may receive a charge related to this service.  I need to obtain your verbal consent now. Are you willing to proceed with your visit today? [name] has provided verbal consent on 11/05/2022 for a virtual visit (video or telephone). [name], [place]  Date: 11/05/2022 9:18 AM  Virtual Visit via Video Note   I, [name], connected with  [name]  ([id number], February 12, 1966) on 11/05/22 at  8:45 AM EST by a video-enabled telemedicine application and verified that I am speaking with the correct person using two identifiers.  Location: Patient: Virtual Visit Location  Patient: Home Provider: Virtual Visit Location Provider: Home Office   I discussed the limitations of evaluation and management by telemedicine and the availability of in person appointments. The patient expressed understanding and agreed to proceed.    History of Present Illness: [name] is a 56 y.o. who identifies as a male who was assigned male at birth, and is being seen today for pain of left heel over the past few days.  Notes pain is most substantial when first placing weight on his foot/heel, improving slightly as he ambulates a bit more, but the pain does not subside.  Notes while resting and elevating the foot for a while the pain will resolve itself.  Pain is throbbing in nature.  Denies any trauma or injury denies any swelling or skin changes.  Denies decreased range of motion of the foot, but does note some pain in the heel with dorsiflexion of the left foot.  Denies symptoms of right foot.  Denies any pain with palpation over the Achilles tendon.  HPI: HPI  Problems:  Patient Active Problem List   Diagnosis Date Noted   Hyperglycemia 07/25/2020   NAFL (nonalcoholic fatty liver) 02/04/2017   HTN (hypertension) 07/01/2011   Chronic seasonal allergic rhinitis due to pollen 07/01/2011   GERD (gastroesophageal reflux disease) 07/01/2011   HLD (hyperlipidemia) 07/01/2011    Allergies:  Allergies  Allergen Reactions   Doxycycline Swelling   Pollen Extract Other (See Comments)    Sneezing.   Zocor [Simvastatin] Other (See Comments)  Stomach Cramps    Medications:  Current Outpatient Medications:    amLODipine (NORVASC) 10 MG tablet, Take 1 tablet (10 mg total) by mouth daily., Disp: 90 tablet, Rfl: 0  Observations/Objective: Patient is well-developed, well-nourished in no acute distress.  Resting comfortably at home.  Head is normocephalic, atraumatic.  No labored breathing. Speech is clear and coherent with logical content.  Patient is alert and oriented at  baseline.   Assessment and Plan: 1. Plantar fasciitis  Symptoms seem most consistent with a plantar fasciitis of left heel.  Atraumatic.  No alarm signs or symptoms.  Recommend compression wrap and elevation.  Ice to the heel (cold can exercises discussed).  Stretching exercises reviewed.  Meloxicam per orders.  Supportive footwear recommended.  Follow-up in person with PCP or at local urgent care for any nonresolving, new or worsening symptoms despite treatment.  Work note provided.  Follow Up Instructions: I discussed the assessment and treatment plan with the patient. The patient was provided an opportunity to ask questions and all were answered. The patient agreed with the plan and demonstrated an understanding of the instructions.  A copy of instructions were sent to the patient via MyChart unless otherwise noted below.   The patient was advised to call back or seek an in-person evaluation if the symptoms worsen or if the condition fails to improve as anticipated.  Time:  I spent 10 minutes with the patient via telehealth technology discussing the above problems/concerns.    [name], PA-C

## 2022-11-05 NOTE — Patient Instructions (Signed)
[name], thank you for joining [name], PA-C for today's virtual visit.  While this provider is not your primary care provider (PCP), if your PCP is located in our provider database this encounter information will be shared with them immediately following your visit.   A [facility] MyChart account gives you access to today's visit and all your visits, tests, and labs performed at [facility] " click here if you don't have a [facility] MyChart account or go to [email or web address]  Consent: (Patient) [name] provided verbal consent for this virtual visit at the beginning of the encounter.  Current Medications:  Current Outpatient Medications:    amLODipine (NORVASC) 10 MG tablet, Take 1 tablet (10 mg total) by mouth daily., Disp: 90 tablet, Rfl: 0   meclizine (ANTIVERT) 25 MG tablet, Take 1 tablet (25 mg total) by mouth 3 (three) times daily as needed for dizziness., Disp: 30 tablet, Rfl: 0   Medications ordered in this encounter:  No orders of the defined types were placed in this encounter.    *If you need refills on other medications prior to your next appointment, please contact your pharmacy*  Follow-Up: Call back or seek an in-person evaluation if the symptoms worsen or if the condition fails to improve as anticipated.  [facility] [phone]  Other Instructions Please elevate the foot while resting. Apply Ace wrap as discussed as compression can help with pain. Always make sure to wear supportive footwear. Take the meloxicam once daily with food.  Can take Tylenol over-the-counter as needed for breakthrough pain. Please do the cold can exercise as discussed. Follow the stretching exercises below. If symptoms or not resolving, or if you note new or worsening symptoms despite treatment, please seek an in person evaluation ASAP.  Please do not delay care.  Plantar Fasciitis Rehab Ask your health care provider which  exercises are safe for you. Do exercises exactly as told by your health care provider and adjust them as directed. It is normal to feel mild stretching, pulling, tightness, or discomfort as you do these exercises. Stop right away if you feel sudden pain or your pain gets worse. Do not begin these exercises until told by your health care provider. Stretching and range-of-motion exercises These exercises warm up your muscles and joints and improve the movement and flexibility of your foot. These exercises also help to relieve pain. Plantar fascia stretch  Sit with your left / right leg crossed over your opposite knee. Hold your heel with one hand with that thumb near your arch. With your other hand, hold your toes and gently pull them back toward the top of your foot. You should feel a stretch on the base (bottom) of your toes, or the bottom of your foot (plantar fascia), or both. Hold this stretch for__________ seconds. Slowly release your toes and return to the starting position. Repeat __________ times. Complete this exercise __________ times a day. Gastrocnemius stretch, standing This exercise is also called a calf (gastroc) stretch. It stretches the muscles in the back of the upper calf. Stand with your hands against a wall. Extend your left / right leg behind you, and bend your front knee slightly. Keeping your heels on the floor, your toes facing forward, and your back knee straight, shift your weight toward the wall. Do not arch your back. You should feel a gentle stretch in your upper calf. Hold this position for __________ seconds. Repeat __________ times. Complete this exercise __________ times  a day. Soleus stretch, standing This exercise is also called a calf (soleus) stretch. It stretches the muscles in the back of the lower calf. Stand with your hands against a wall. Extend your left / right leg behind you, and bend your front knee slightly. Keeping your heels on the floor and your  toes facing forward, bend your back knee and shift your weight slightly over your back leg. You should feel a gentle stretch deep in your lower calf. Hold this position for __________ seconds. Repeat __________ times. Complete this exercise __________ times a day. Gastroc and soleus stretch, standing step This exercise stretches the muscles in the back of the lower leg. These muscles are in the upper calf (gastrocnemius) and the lower calf (soleus). Stand with the ball of your left / right foot on the front of a step. The ball of your foot is on the walking surface, right under your toes. Keep your other foot firmly on the same step. Hold on to the wall or a railing for balance. Slowly lift your other foot, allowing your body weight to press your heel down over the edge of the front of the step. Keep knee straight and unbent. You should feel a stretch in your calf. Hold this position for __________ seconds. Return both feet to the step. Repeat this exercise with a slight bend in your left / right knee. Repeat __________ times with your left / right knee straight and __________ times with your left / right knee bent. Complete this exercise __________ times a day. Balance exercise This exercise builds your balance and strength control of your arch to help take pressure off your plantar fascia. Single leg stand If this exercise is too easy, you can try it with your eyes closed or while standing on a pillow. Without shoes, stand near a railing or in a doorway. You may hold on to the railing or door frame as needed. Stand on your left / right foot. Keep your big toe down on the floor and lift the arch of your foot. You should feel a stretch across the bottom of your foot and your arch. Do not let your foot roll inward. Hold this position for __________ seconds. Repeat __________ times. Complete this exercise __________ times a day. This information is not intended to replace advice given to you by  your health care provider. Make sure you discuss any questions you have with your health care provider. Document Revised: 09/19/2020 Document Reviewed: 09/19/2020 Elsevier Patient Education  2023 Elsevier Inc.    If you have been instructed to have an in-person evaluation today at a local Urgent Care facility, please use the link below. It will take you to a list of all of our available [facility], including address, phone number and hours of operation. Please do not delay care.  [facility]  If you or a family member do not have a primary care provider, use the link below to schedule a visit and establish care. When you choose a [facility] primary care physician or advanced practice provider, you gain a long-term partner in health. Find a Primary Care Provider  Learn more about [facility]'s in-office and virtual care options: [facility] - Get Care Now

## 2022-11-05 NOTE — Telephone Encounter (Signed)
Thank you for sending to me [facility]. I appreciate it.

## 2022-12-24 ENCOUNTER — Encounter: Payer: Self-pay | Admitting: Family Medicine

## 2022-12-25 NOTE — Telephone Encounter (Signed)
Patient aware need OV for medication refills  Stated will go to [facility] for refills  Patient requesting to removed Dr Jerline Pain as his PCP

## 2022-12-25 NOTE — Telephone Encounter (Signed)
Noted. Pcp removed.

## 2022-12-28 ENCOUNTER — Telehealth: Payer: Self-pay | Admitting: Physician Assistant

## 2022-12-28 DIAGNOSIS — I10 Essential (primary) hypertension: Secondary | ICD-10-CM

## 2022-12-28 MED ORDER — AMLODIPINE BESYLATE 10 MG PO TABS: 10.0000 mg | ORAL_TABLET | Freq: Every day | ORAL | 0 refills | Status: DC

## 2022-12-28 NOTE — Patient Instructions (Signed)
[name], thank you for joining Margaretann Loveless, PA-C for today's virtual visit.  While this provider is not your primary care provider (PCP), if your PCP is located in our provider database this encounter information will be shared with them immediately following your visit.   [facility] MyChart account gives you access to today's visit and all your visits, tests, and labs performed at [facility] " click here if you don't have a [facility] MyChart account or go to [email or web address]  Consent: (Patient) [name] provided verbal consent for this virtual visit at the beginning of the encounter.  Current Medications:  Current Outpatient Medications:    amLODipine (NORVASC) 10 MG tablet, Take 1 tablet (10 mg total) by mouth daily., Disp: 90 tablet, Rfl: 0   meloxicam (MOBIC) 15 MG tablet, Take 1 tablet (15 mg total) by mouth daily., Disp: 30 tablet, Rfl: 0   Medications ordered in this encounter:  Meds ordered this encounter  Medications   amLODipine (NORVASC) 10 MG tablet    Sig: Take 1 tablet (10 mg total) by mouth daily.    Dispense:  90 tablet    Refill:  0    Order Specific Question:   Supervising Provider    Answer:   Merrilee Jansky [id number]     *If you need refills on other medications prior to your next appointment, please contact your pharmacy*  Follow-Up: Call back or seek an in-person evaluation if the symptoms worsen or if the condition fails to improve as anticipated.  [facility] [phone]  Other Instructions Managing Your Hypertension Hypertension, also called high blood pressure, is when the force of the blood pressing against the walls of the arteries is too strong. Arteries are blood vessels that carry blood from your heart throughout your body. Hypertension forces the heart to work harder to pump blood and may cause the arteries to become narrow or stiff. Understanding blood pressure readings A blood  pressure reading includes a higher number over a lower number: The first, or top, number is called the systolic pressure. It is a measure of the pressure in your arteries as your heart beats. The second, or bottom number, is called the diastolic pressure. It is a measure of the pressure in your arteries as the heart relaxes. For most people, a normal blood pressure is below 120/80. Your personal target blood pressure may vary depending on your medical conditions, your age, and other factors. Blood pressure is classified into four stages. Based on your blood pressure reading, your health care provider may use the following stages to determine what type of treatment you need, if any. Systolic pressure and diastolic pressure are measured in a unit called millimeters of mercury (mmHg). Normal Systolic pressure: below 120. Diastolic pressure: below 80. Elevated Systolic pressure: 120-129. Diastolic pressure: below 80. Hypertension stage 1 Systolic pressure: 130-139. Diastolic pressure: 80-89. Hypertension stage 2 Systolic pressure: 140 or above. Diastolic pressure: 90 or above. How can this condition affect me? Managing your hypertension is very important. Over time, hypertension can damage the arteries and decrease blood flow to parts of the body, including the brain, heart, and kidneys. Having untreated or uncontrolled hypertension can lead to: A heart attack. A stroke. A weakened blood vessel (aneurysm). Heart failure. Kidney damage. Eye damage. Memory and concentration problems. Vascular dementia. What actions can I take to manage this condition? Hypertension can be managed by making lifestyle changes and possibly by taking medicines. Your health care provider  will help you make a plan to bring your blood pressure within a normal range. You may be referred for counseling on a healthy diet and physical activity. Nutrition  Eat a diet that is high in fiber and potassium, and low in salt  (sodium), added sugar, and fat. An example eating plan is called the DASH diet. DASH stands for Dietary Approaches to Stop Hypertension. To eat this way: Eat plenty of fresh fruits and vegetables. Try to fill one-half of your plate at each meal with fruits and vegetables. Eat whole grains, such as whole-wheat pasta, brown rice, or whole-grain bread. Fill about one-fourth of your plate with whole grains. Eat low-fat dairy products. Avoid fatty cuts of meat, processed or cured meats, and poultry with skin. Fill about one-fourth of your plate with lean proteins such as fish, chicken without skin, beans, eggs, and tofu. Avoid pre-made and processed foods. These tend to be higher in sodium, added sugar, and fat. Reduce your daily sodium intake. Many people with hypertension should eat less than 1,500 mg of sodium a day. Lifestyle  Work with your health care provider to maintain a healthy body weight or to lose weight. Ask what an ideal weight is for you. Get at least 30 minutes of exercise that causes your heart to beat faster (aerobic exercise) most days of the week. Activities may include walking, swimming, or biking. Include exercise to strengthen your muscles (resistance exercise), such as weight lifting, as part of your weekly exercise routine. Try to do these types of exercises for 30 minutes at least 3 days a week. Do not use any products that contain nicotine or tobacco. These products include cigarettes, chewing tobacco, and vaping devices, such as e-cigarettes. If you need help quitting, ask your health care provider. Control any long-term (chronic) conditions you have, such as high cholesterol or diabetes. Identify your sources of stress and find ways to manage stress. This may include meditation, deep breathing, or making time for fun activities. Alcohol use Do not drink alcohol if: Your health care provider tells you not to drink. You are pregnant, may be pregnant, or are planning to become  pregnant. If you drink alcohol: Limit how much you have to: 0-1 drink a day for women. 0-2 drinks a day for men. Know how much alcohol is in your drink. In the [place] one drink equals one 12 oz bottle of beer (355 mL), one 5 oz glass of wine (148 mL), or one 1 oz glass of hard liquor (44 mL). Medicines Your health care provider may prescribe medicine if lifestyle changes are not enough to get your blood pressure under control and if: Your systolic blood pressure is 130 or higher. Your diastolic blood pressure is 80 or higher. Take medicines only as told by your health care provider. Follow the directions carefully. Blood pressure medicines must be taken as told by your health care provider. The medicine does not work as well when you skip doses. Skipping doses also puts you at risk for problems. Monitoring Before you monitor your blood pressure: Do not smoke, drink caffeinated beverages, or exercise within 30 minutes before taking a measurement. Use the bathroom and empty your bladder (urinate). Sit quietly for at least 5 minutes before taking measurements. Monitor your blood pressure at home as told by your health care provider. To do this: Sit with your back straight and supported. Place your feet flat on the floor. Do not cross your legs. Support your arm on a flat surface,  such as a table. Make sure your upper arm is at heart level. Each time you measure, take two or three readings one minute apart and record the results. You may also need to have your blood pressure checked regularly by your health care provider. General information Talk with your health care provider about your diet, exercise habits, and other lifestyle factors that may be contributing to hypertension. Review all the medicines you take with your health care provider because there may be side effects or interactions. Keep all follow-up visits. Your health care provider can help you create and adjust your plan for  managing your high blood pressure. Where to find more information National Heart, Lung, and Blood Institute: https://wilson-eaton.com/ American Heart Association: www.heart.org Contact a health care provider if: You think you are having a reaction to medicines you have taken. You have repeated (recurrent) headaches. You feel dizzy. You have swelling in your ankles. You have trouble with your vision. Get help right away if: You develop a severe headache or confusion. You have unusual weakness or numbness, or you feel faint. You have severe pain in your chest or abdomen. You vomit repeatedly. You have trouble breathing. These symptoms may be an emergency. Get help right away. Call 911. Do not wait to see if the symptoms will go away. Do not drive yourself to the hospital. Summary Hypertension is when the force of blood pumping through your arteries is too strong. If this condition is not controlled, it may put you at risk for serious complications. Your personal target blood pressure may vary depending on your medical conditions, your age, and other factors. For most people, a normal blood pressure is less than 120/80. Hypertension is managed by lifestyle changes, medicines, or both. Lifestyle changes to help manage hypertension include losing weight, eating a healthy, low-sodium diet, exercising more, stopping smoking, and limiting alcohol. This information is not intended to replace advice given to you by your health care provider. Make sure you discuss any questions you have with your health care provider. Document Revised: 08/21/2021 Document Reviewed: 08/21/2021 Elsevier Patient Education  [place].    If you have been instructed to have an in-person evaluation today at a local Urgent Care facility, please use the link below. It will take you to a list of all of our available [facility], including address, phone number and hours of operation. Please do not delay care.   [facility]  If you or a family member do not have a primary care provider, use the link below to schedule a visit and establish care. When you choose a [facility] primary care physician or advanced practice provider, you gain a long-term partner in health. Find a Primary Care Provider  Learn more about [facility]'s in-office and virtual care options: [facility]

## 2022-12-28 NOTE — Progress Notes (Signed)
Virtual Visit Consent   [name], you are scheduled for a virtual visit with a [facility] provider today. Just as with appointments in the office, your consent must be obtained to participate. Your consent will be active for this visit and any virtual visit you may have with one of our providers in the next 365 days. If you have a MyChart account, a copy of this consent can be sent to you electronically.  As this is a virtual visit, video technology does not allow for your provider to perform a traditional examination. This may limit your provider's ability to fully assess your condition. If your provider identifies any concerns that need to be evaluated in person or the need to arrange testing (such as labs, EKG, etc.), we will make arrangements to do so. Although advances in technology are sophisticated, we cannot ensure that it will always work on either your end or our end. If the connection with a video visit is poor, the visit may have to be switched to a telephone visit. With either a video or telephone visit, we are not always able to ensure that we have a secure connection.  By engaging in this virtual visit, you consent to the provision of healthcare and authorize for your insurance to be billed (if applicable) for the services provided during this visit. Depending on your insurance coverage, you may receive a charge related to this service.  I need to obtain your verbal consent now. Are you willing to proceed with your visit today? [name] has provided verbal consent on 12/28/2022 for a virtual visit (video or telephone). Margaretann Loveless, PA-C  Date: 12/28/2022 12:22 PM  Virtual Visit via Video Note   I, Margaretann Loveless, connected with  [name]  ([id number], 12/30/1965) on 12/28/22 at 12:15 PM EST by a video-enabled telemedicine application and verified that I am speaking with the correct person using two identifiers.  Location: Patient: Virtual Visit Location Patient:  Home Provider: Virtual Visit Location Provider: Home Office   I discussed the limitations of evaluation and management by telemedicine and the availability of in person appointments. The patient expressed understanding and agreed to proceed.    History of Present Illness: [name] is a 57 y.o. who identifies as a male who was assigned male at birth, and is being seen today for medication refill. Having increased headaches in the left occipital reqion that radiates to the left ear. Has had these headaches before when he has been out of medications. He reports he has been on Amlodipine for 10 years without issue. His previous PCP left and he was moved to [place]. He has not seen them yet. He asked for a refill at the beginning of the year and was denied stating he needed an in person visit. He reports he has no insurance at this time and will require time to save money for a visit. He has been seen virtually on 10/04/22 for vertigo and getting his medication refilled x 90 days at that time.    Problems:  Patient Active Problem List   Diagnosis Date Noted   Hyperglycemia 07/25/2020   NAFL (nonalcoholic fatty liver) 02/04/2017   HTN (hypertension) 07/01/2011   Chronic seasonal allergic rhinitis due to pollen 07/01/2011   GERD (gastroesophageal reflux disease) 07/01/2011   HLD (hyperlipidemia) 07/01/2011    Allergies:  Allergies  Allergen Reactions   Doxycycline Swelling   Pollen Extract Other (See Comments)    Sneezing.   Zocor [  Simvastatin] Other (See Comments)    Stomach Cramps    Medications:  Current Outpatient Medications:    amLODipine (NORVASC) 10 MG tablet, Take 1 tablet (10 mg total) by mouth daily., Disp: 90 tablet, Rfl: 0   meloxicam (MOBIC) 15 MG tablet, Take 1 tablet (15 mg total) by mouth daily., Disp: 30 tablet, Rfl: 0  Observations/Objective: Patient is well-developed, well-nourished in no acute distress.  Resting comfortably at home.  Head is  normocephalic, atraumatic.  No labored breathing.  Speech is clear and coherent with logical content.  Patient is alert and oriented at baseline.    Assessment and Plan: 1. Essential hypertension - amLODipine (NORVASC) 10 MG tablet; Take 1 tablet (10 mg total) by mouth daily.  Dispense: 90 tablet; Refill: 0  - Advised patient that we will not refill Amlodipine any longer after this refill virtually; he will require a face to face either with UC or his PCP office; discussed he should see his PCP office for an annual wellness exam and can get medications refilled then and then be seen annually for this to help alleviate cost burden, he voiced understanding - Amlodipine was refilled x 90 days; NO MORE REFILLS FROM VIRTUAL TEAM UNTIL FACE TO FACE APPT  - Advised to follow up if headache not relieved with restarting amlodipine   Follow Up Instructions: I discussed the assessment and treatment plan with the patient. The patient was provided an opportunity to ask questions and all were answered. The patient agreed with the plan and demonstrated an understanding of the instructions.  A copy of instructions were sent to the patient via MyChart unless otherwise noted below.    The patient was advised to call back or seek an in-person evaluation if the symptoms worsen or if the condition fails to improve as anticipated.  Time:  I spent 10 minutes with the patient via telehealth technology discussing the above problems/concerns.    Mar Daring, PA-C

## 2023-03-18 ENCOUNTER — Telehealth: Payer: Self-pay | Admitting: Family Medicine

## 2023-03-18 DIAGNOSIS — J40 Bronchitis, not specified as acute or chronic: Secondary | ICD-10-CM

## 2023-03-18 DIAGNOSIS — T148XXA Other injury of unspecified body region, initial encounter: Secondary | ICD-10-CM

## 2023-03-18 MED ORDER — AMOXICILLIN-POT CLAVULANATE 875-125 MG PO TABS: 1.0000 | ORAL_TABLET | Freq: Two times a day (BID) | ORAL | 0 refills | Status: AC

## 2023-03-18 MED ORDER — CYCLOBENZAPRINE HCL 10 MG PO TABS: 10.0000 mg | ORAL_TABLET | Freq: Three times a day (TID) | ORAL | 0 refills | Status: DC | PRN

## 2023-03-18 MED ORDER — PREDNISONE 10 MG (21) PO TBPK: ORAL_TABLET | ORAL | 0 refills | Status: DC

## 2023-03-18 MED ORDER — NAPROXEN 500 MG PO TABS
500.0000 mg | ORAL_TABLET | Freq: Two times a day (BID) | ORAL | 0 refills | Status: DC
Start: 2023-03-18 — End: 2024-06-19

## 2023-03-18 MED ORDER — FLUTICASONE PROPIONATE 50 MCG/ACT NA SUSP
2.0000 | Freq: Every day | NASAL | 0 refills | Status: DC
Start: 2023-03-18 — End: 2024-06-19

## 2023-03-18 NOTE — Patient Instructions (Addendum)
[name], thank you for joining Perlie Mayo, NP for today's virtual visit.  While this provider is not your primary care provider (PCP), if your PCP is located in our provider database this encounter information will be shared with them immediately following your visit.   [facility] account gives you access to today's visit and all your visits, tests, and labs performed at [facility] " click here if you don't have a [facility] account or go to [email or web address]  Consent: (Patient) [name] provided verbal consent for this virtual visit at the beginning of the encounter.  Current Medications:  Current Outpatient Medications:    [START ON 03/20/2023] amoxicillin-clavulanate (AUGMENTIN) 875-125 MG tablet, Take 1 tablet by mouth 2 (two) times daily for 7 days., Disp: 14 tablet, Rfl: 0   cyclobenzaprine (FLEXERIL) 10 MG tablet, Take 1 tablet (10 mg total) by mouth 3 (three) times daily as needed for muscle spasms., Disp: 30 tablet, Rfl: 0   fluticasone (FLONASE) 50 MCG/ACT nasal spray, Place 2 sprays into both nostrils daily., Disp: 16 g, Rfl: 0   naproxen (NAPROSYN) 500 MG tablet, Take 1 tablet (500 mg total) by mouth 2 (two) times daily with a meal., Disp: 30 tablet, Rfl: 0   predniSONE (STERAPRED UNI-PAK 21 TAB) 10 MG (21) TBPK tablet, Take as directed, Disp: 21 tablet, Rfl: 0   amLODipine (NORVASC) 10 MG tablet, Take 1 tablet (10 mg total) by mouth daily., Disp: 90 tablet, Rfl: 0   Medications ordered in this encounter:  Meds ordered this encounter  Medications   predniSONE (STERAPRED UNI-PAK 21 TAB) 10 MG (21) TBPK tablet    Sig: Take as directed    Dispense:  21 tablet    Refill:  0    Order Specific Question:   Supervising Provider    Answer:   Chase Picket [id number]   fluticasone (FLONASE) 50 MCG/ACT nasal spray    Sig: Place 2 sprays into both nostrils daily.    Dispense:  16 g    Refill:  0    Order Specific Question:    Supervising Provider    Answer:   Chase Picket [id number]   amoxicillin-clavulanate (AUGMENTIN) 875-125 MG tablet    Sig: Take 1 tablet by mouth 2 (two) times daily for 7 days.    Dispense:  14 tablet    Refill:  0    Order Specific Question:   Supervising Provider    Answer:   Chase Picket [id number]   cyclobenzaprine (FLEXERIL) 10 MG tablet    Sig: Take 1 tablet (10 mg total) by mouth 3 (three) times daily as needed for muscle spasms.    Dispense:  30 tablet    Refill:  0    Order Specific Question:   Supervising Provider    Answer:   Chase Picket [id number]   naproxen (NAPROSYN) 500 MG tablet    Sig: Take 1 tablet (500 mg total) by mouth 2 (two) times daily with a meal.    Dispense:  30 tablet    Refill:  0    Order Specific Question:   Supervising Provider    Answer:   Chase Picket [id number]     *If you need refills on other medications prior to your next appointment, please contact your pharmacy*  Follow-Up: Call back or seek an in-person evaluation if the symptoms worsen or if the condition fails to improve as anticipated.  [facility] [phone]  Other Instructions  If you do not have a PCP, [facility] offers a free physician referral service available at [phone]. Our trained staff has the experience, knowledge and resources to put you in touch with a physician who is right for you.    - Take meds as prescribed, take as Flonase and Prednisone first, Delayed Rx for Antibiotic to be filled Saturday if not feeling better - Rest voice - Use a cool mist humidifier especially during the winter months when heat dries out the air. - Use saline nose sprays frequently to help soothe nasal passages if they are drying out. - Stay hydrated by drinking plenty of fluids   If you do not improve you will need a follow up visit in person.              Muscle Strain A muscle strain, or pulled muscle, happens when a muscle is stretched beyond  its normal length. This can tear some muscle fibers and cause pain. Usually, it takes 1-2 weeks to heal from a muscle strain. Full healing normally takes 5-6 weeks. What are the causes? This condition is caused when a sudden force is placed on a muscle and stretches it too far. This can happen with a fall, while lifting, or during sports. What increases the risk? You are more likely to develop a muscle strain if you are an athlete or you do a lot of physical activity. What are the signs or symptoms? Pain. Tenderness. Bruising. Swelling. Trouble using the muscle. How is this treated? This condition is first treated with PRICE therapy. This involves: Protecting your muscle from being injured again. Resting your injured muscle. Icing your injured muscle. Putting pressure (compression) on your injured muscle. This may be done with a splint or elastic bandage. Raising (elevating) your injured muscle. Your doctor may also recommend medicine for pain. Follow these instructions at home: If you have a splint that can be taken off: Wear the splint as told by your doctor. Take it off only as told by your doctor. Check the skin around the splint every day. Tell your doctor if you see problems. Loosen the splint if your fingers or toes: Tingle. Become numb. Turn cold and blue. Keep the splint clean. If the splint is not waterproof: Do not let it get wet. Cover it with a watertight covering when you take a bath or a shower. Managing pain, stiffness, and swelling  If told, put ice on your injured area. To do this: If you have a removable splint, take it off as told by your doctor. Put ice in a plastic bag. Place a towel between your skin and the bag. Leave the ice on for 20 minutes, 2-3 times a day. Take off the ice if your skin turns bright red. This is very important. If you cannot feel pain, heat, or cold, you have a greater risk of damage to the area. Move your fingers or toes  often. Raise the injured area above the level of your heart while you are sitting or lying down. Wear an elastic bandage as told by your doctor. Make sure it is not too tight. General instructions Take over-the-counter and prescription medicines only as told by your doctor. This may include: Medicines for pain and swelling that are taken by mouth or put on the skin. Medicines to help relax your muscles. Limit your activity. Rest your injured muscle as told by your doctor. Your doctor may say that gentle movements are okay. If physical  therapy was prescribed, do exercises as told by your doctor. Do not put pressure on any part of the splint until it is fully hardened. This may take many hours. Do not smoke or use any products that contain nicotine or tobacco. If you need help quitting, ask your doctor. Ask your doctor when it is safe to drive if you have a splint. Keep all follow-up visits. How is this prevented? Warm up before you exercise. This helps to prevent more muscle strains. Contact a doctor if: You have more pain or swelling in the injured area. Get help right away if: You have any of these problems in your injured area: Numbness. Tingling. Less strength than normal. Summary A muscle strain is an injury that happens when a muscle is stretched beyond normal length. This condition is first treated with PRICE therapy. This includes protecting, resting, icing, adding pressure, and raising your injury. Limit your activity. Rest your injured muscle as told by your doctor. Your doctor may say that gentle movements are okay. Warm up before you exercise. This helps to prevent more muscle strains. This information is not intended to replace advice given to you by your health care provider. Make sure you discuss any questions you have with your health care provider. Document Revised: 02/24/2021 Document Reviewed: 02/24/2021 Elsevier Patient Education  [place].     If you  have been instructed to have an in-person evaluation today at a local Urgent Care facility, please use the link below. It will take you to a list of all of our available [facility], including address, phone number and hours of operation. Please do not delay care.  [facility]  If you or a family member do not have a primary care provider, use the link below to schedule a visit and establish care. When you choose a [facility] primary care physician or advanced practice provider, you gain a long-term partner in health. Find a Primary Care Provider     If you do not have a PCP, [facility] offers a free physician referral service available at [phone]. Our trained staff has the experience, knowledge and resources to put you in touch with a physician who is right for you.     Learn more about [facility]'s in-office and virtual care options: [facility]

## 2023-03-18 NOTE — Progress Notes (Signed)
Virtual Visit Consent   [name], you are scheduled for a virtual visit with a [facility] provider today. Just as with appointments in the office, your consent must be obtained to participate. Your consent will be active for this visit and any virtual visit you may have with one of our providers in the next 365 days. If you have a MyChart account, a copy of this consent can be sent to you electronically.  As this is a virtual visit, video technology does not allow for your provider to perform a traditional examination. This may limit your provider's ability to fully assess your condition. If your provider identifies any concerns that need to be evaluated in person or the need to arrange testing (such as labs, EKG, etc.), we will make arrangements to do so. Although advances in technology are sophisticated, we cannot ensure that it will always work on either your end or our end. If the connection with a video visit is poor, the visit may have to be switched to a telephone visit. With either a video or telephone visit, we are not always able to ensure that we have a secure connection.  By engaging in this virtual visit, you consent to the provision of healthcare and authorize for your insurance to be billed (if applicable) for the services provided during this visit. Depending on your insurance coverage, you may receive a charge related to this service.  I need to obtain your verbal consent now. Are you willing to proceed with your visit today? [name] has provided verbal consent on 03/18/2023 for a virtual visit (video or telephone). Perlie Mayo, NP  Date: 03/18/2023 11:38 AM  Virtual Visit via Video Note   I, Perlie Mayo, connected with  [name]  ([id number], 02/28/1966) on 03/18/23 at 11:30 AM EDT by a video-enabled telemedicine application and verified that I am speaking with the correct person using two identifiers.  Location: Patient: Virtual Visit Location Patient:  Home Provider: Virtual Visit Location Provider: Home Office   I discussed the limitations of evaluation and management by telemedicine and the availability of in person appointments. The patient expressed understanding and agreed to proceed.    History of Present Illness: [name] is a 57 y.o. who identifies as a male who was assigned male at birth, and is being seen today for cough and back pain.  Onset was a week to 10 days ago with a cough, that has progressed Associated symptoms are chest tightness Modifying factors are vapor rub, cough drops and OTC meds Denies chest pain, shortness of breath, fevers, chills, non smoker Known allergies- but has improved in last several years Thought this was allergies to start and took allegra D without relief  Exposure to sick contacts- unknown COVID test: none   New onset of back pain for yesterday day when lifting dog crate. This pain is located in the lower left back without radiation. Pain score today in office is rated 8/10. At its worse the pain is a level 10/10. It has started to disturb sleep and limiting movement. It is relieved by staying still and sitting still It is aggravated by when lifting and twisting and engaging core, and walking Modifying factors have included pillowing for position and ibuprofen didn't help much  There is no associated lower extremity numbness or weakness. There is no associated incontinence of stool or urine.  Problems:  Patient Active Problem List   Diagnosis Date Noted   Hyperglycemia 07/25/2020   NAFL (  nonalcoholic fatty liver) [id number]   HTN (hypertension) 07/01/2011   Chronic seasonal allergic rhinitis due to pollen 07/01/2011   GERD (gastroesophageal reflux disease) 07/01/2011   HLD (hyperlipidemia) 07/01/2011    Allergies:  Allergies  Allergen Reactions   Doxycycline Swelling   Pollen Extract Other (See Comments)    Sneezing.   Zocor [Simvastatin] Other (See Comments)    Stomach  Cramps    Medications:  Current Outpatient Medications:    amLODipine (NORVASC) 10 MG tablet, Take 1 tablet (10 mg total) by mouth daily., Disp: 90 tablet, Rfl: 0   meloxicam (MOBIC) 15 MG tablet, Take 1 tablet (15 mg total) by mouth daily., Disp: 30 tablet, Rfl: 0  Observations/Objective: Patient is well-developed, well-nourished in no acute distress.  Resting comfortably  at home.  Head is normocephalic, atraumatic.  No labored breathing.  Speech is clear and coherent with logical content.  Patient is alert and oriented at baseline.  Cough present  Assessment and Plan:  1. Bronchitis  - predniSONE (STERAPRED UNI-PAK 21 TAB) 10 MG (21) TBPK tablet; Take as directed  Dispense: 21 tablet; Refill: 0 - fluticasone (FLONASE) 50 MCG/ACT nasal spray; Place 2 sprays into both nostrils daily.  Dispense: 16 g; Refill: 0 - amoxicillin-clavulanate (AUGMENTIN) 875-125 MG tablet; Take 1 tablet by mouth 2 (two) times daily for 7 days.  Dispense: 14 tablet; Refill: 0   - Take meds as prescribed, take as Flonase and Prednisone first, Delayed Rx for Antibiotic to be filled Saturday if not feeling better - Rest voice - Use a cool mist humidifier especially during the winter months when heat dries out the air. - Use saline nose sprays frequently to help soothe nasal passages if they are drying out. - Stay hydrated by drinking plenty of fluids   If you do not improve you will need a follow up visit in person.   2. Pulled muscle  - cyclobenzaprine (FLEXERIL) 10 MG tablet; Take 1 tablet (10 mg total) by mouth 3 (three) times daily as needed for muscle spasms.  Dispense: 30 tablet; Refill: 0 - naproxen (NAPROSYN) 500 MG tablet; Take 1 tablet (500 mg total) by mouth 2 (two) times daily with a meal.  Dispense: 30 tablet; Refill: 0  -info on AVS for care   Reviewed side effects, risks and benefits of medication.    Patient acknowledged agreement and understanding of the plan.   Past Medical,  Surgical, Social History, Allergies, and Medications have been Reviewed.     Follow Up Instructions: I discussed the assessment and treatment plan with the patient. The patient was provided an opportunity to ask questions and all were answered. The patient agreed with the plan and demonstrated an understanding of the instructions.  A copy of instructions were sent to the patient via MyChart unless otherwise noted below.    The patient was advised to call back or seek an in-person evaluation if the symptoms worsen or if the condition fails to improve as anticipated.  Time:  I spent 15 minutes with the patient via telehealth technology discussing the above problems/concerns.    Perlie Mayo, NP

## 2023-05-28 ENCOUNTER — Telehealth: Payer: Self-pay | Admitting: Nurse Practitioner

## 2023-05-28 DIAGNOSIS — M25562 Pain in left knee: Secondary | ICD-10-CM

## 2023-05-28 DIAGNOSIS — M779 Enthesopathy, unspecified: Secondary | ICD-10-CM

## 2023-05-28 MED ORDER — PREDNISONE 10 MG PO TABS: ORAL_TABLET | ORAL | 0 refills | Status: DC

## 2023-05-28 NOTE — Progress Notes (Signed)
Virtual Visit Consent   [name], you are scheduled for a virtual visit with a [facility] provider today. Just as with appointments in the office, your consent must be obtained to participate. Your consent will be active for this visit and any virtual visit you may have with one of our providers in the next 365 days. If you have a MyChart account, a copy of this consent can be sent to you electronically.  As this is a virtual visit, video technology does not allow for your provider to perform a traditional examination. This may limit your provider's ability to fully assess your condition. If your provider identifies any concerns that need to be evaluated in person or the need to arrange testing (such as labs, EKG, etc.), we will make arrangements to do so. Although advances in technology are sophisticated, we cannot ensure that it will always work on either your end or our end. If the connection with a video visit is poor, the visit may have to be switched to a telephone visit. With either a video or telephone visit, we are not always able to ensure that we have a secure connection.  By engaging in this virtual visit, you consent to the provision of healthcare and authorize for your insurance to be billed (if applicable) for the services provided during this visit. Depending on your insurance coverage, you may receive a charge related to this service.  I need to obtain your verbal consent now. Are you willing to proceed with your visit today? [name] has provided verbal consent on 05/28/2023 for a virtual visit (video or telephone). Keith Simas, FNP  Date: 05/28/2023 11:06 AM  Virtual Visit via Video Note   I, [name], connected with  [name]  ([id number], 1966/07/05) on 05/28/23 at 11:15 AM EDT by a video-enabled telemedicine application and verified that I am speaking with the correct person using two identifiers.  Location: Patient: Virtual Visit Location Patient: Home Provider:  Virtual Visit Location Provider: Home Office   I discussed the limitations of evaluation and management by telemedicine and the availability of in person appointments. The patient expressed understanding and agreed to proceed.    History of Present Illness: [name] is a 57 y.o. who identifies as a male who was assigned male at birth, and is being seen today for pain in his left knee  Symptom onset was 2 nights ago  He woke up to use the restroom and had his knee give out  He had pain when trying to straighten his knee   Yesterday the pain increased, he started to use crutched to avoid putting weight on his knee  Pain is in the front of his knee  He has happened this happened in the past several years ago, and he is unsure if it was the same knee or not. He used NSAIDs at the time and it was relieved without any return   Denies any swelling in knee or below the knee   Pain is relieved when the knee is bent and is painful when he straightens his leg  Ibuprofen seems to relieve the pain   The pain waked him up when he tries to sleep   He denies any surgery in the past    Problems:  Patient Active Problem List   Diagnosis Date Noted   Hyperglycemia 07/25/2020   NAFL (nonalcoholic fatty liver) 02/04/2017   HTN (hypertension) 07/01/2011   Chronic seasonal allergic rhinitis due to pollen 07/01/2011  GERD (gastroesophageal reflux disease) 07/01/2011   HLD (hyperlipidemia) 07/01/2011    Allergies:  Allergies  Allergen Reactions   Doxycycline Swelling   Pollen Extract Other (See Comments)    Sneezing.   Zocor [[name]] Other (See Comments)    Stomach Cramps    Medications:  Current Outpatient Medications:    amLODipine (NORVASC) 10 MG tablet, Take 1 tablet (10 mg total) by mouth daily., Disp: 90 tablet, Rfl: 0   cyclobenzaprine (FLEXERIL) 10 MG tablet, Take 1 tablet (10 mg total) by mouth 3 (three) times daily as needed for muscle spasms., Disp: 30 tablet, Rfl: 0    fluticasone (FLONASE) 50 MCG/ACT nasal spray, Place 2 sprays into both nostrils daily., Disp: 16 g, Rfl: 0   naproxen (NAPROSYN) 500 MG tablet, Take 1 tablet (500 mg total) by mouth 2 (two) times daily with a meal., Disp: 30 tablet, Rfl: 0   predniSONE (STERAPRED UNI-PAK 21 TAB) 10 MG (21) TBPK tablet, Take as directed, Disp: 21 tablet, Rfl: 0  Observations/Objective: Patient is well-developed, well-nourished in no acute distress.  Resting comfortably  at home.  Head is normocephalic, atraumatic.  No labored breathing.  Speech is clear and coherent with logical content.  Patient is alert and oriented at baseline.    Assessment and Plan: 1. Acute pain of left knee  - predniSONE (DELTASONE) 10 MG tablet; Take 4 tablets (40mg ) on days 1-4, then 3 tablets (30mg ) on days 5-8, then 2 tablets (20mg ) on days 9-11, then 1 tablet daily for days 12-14. Take with food.  Dispense: 37 tablet; Refill: 0  2. Tendinitis     - predniSONE (DELTASONE) 10 MG tablet; Take 4 tablets (40mg ) on days 1-4, then 3 tablets (30mg ) on days 5-8, then 2 tablets (20mg ) on days 9-11, then 1 tablet daily for days 12-14. Take with food.  Dispense: 37 tablet; Refill: 0     Follow Up Instructions: I discussed the assessment and treatment plan with the patient. The patient was provided an opportunity to ask questions and all were answered. The patient agreed with the plan and demonstrated an understanding of the instructions.  A copy of instructions were sent to the patient via MyChart unless otherwise noted below.    The patient was advised to call back or seek an in-person evaluation if the symptoms worsen or if the condition fails to improve as anticipated.  Time:  I spent 15 minutes with the patient via telehealth technology discussing the above problems/concerns.    Keith Simas, FNP

## 2023-08-08 ENCOUNTER — Encounter: Payer: Self-pay | Admitting: Nurse Practitioner

## 2023-10-25 ENCOUNTER — Telehealth: Payer: Self-pay | Admitting: Physician Assistant

## 2023-10-25 DIAGNOSIS — M25562 Pain in left knee: Secondary | ICD-10-CM

## 2023-10-25 DIAGNOSIS — Z760 Encounter for issue of repeat prescription: Secondary | ICD-10-CM

## 2023-10-25 MED ORDER — AMLODIPINE BESYLATE 10 MG PO TABS: 10.0000 mg | ORAL_TABLET | Freq: Every day | ORAL | 1 refills | Status: DC

## 2023-10-25 MED ORDER — PREDNISONE 20 MG PO TABS: 40.0000 mg | ORAL_TABLET | Freq: Every day | ORAL | 0 refills | Status: AC

## 2023-10-25 NOTE — Progress Notes (Signed)
Virtual Visit Consent   [name], you are scheduled for a virtual visit with a [facility] provider today. Just as with appointments in the office, your consent must be obtained to participate. Your consent will be active for this visit and any virtual visit you may have with one of our providers in the next 365 days. If you have a MyChart account, a copy of this consent can be sent to you electronically.  As this is a virtual visit, video technology does not allow for your provider to perform a traditional examination. This may limit your provider's ability to fully assess your condition. If your provider identifies any concerns that need to be evaluated in person or the need to arrange testing (such as labs, EKG, etc.), we will make arrangements to do so. Although advances in technology are sophisticated, we cannot ensure that it will always work on either your end or our end. If the connection with a video visit is poor, the visit may have to be switched to a telephone visit. With either a video or telephone visit, we are not always able to ensure that we have a secure connection.  By engaging in this virtual visit, you consent to the provision of healthcare and authorize for your insurance to be billed (if applicable) for the services provided during this visit. Depending on your insurance coverage, you may receive a charge related to this service.  I need to obtain your verbal consent now. Are you willing to proceed with your visit today? [name] has provided verbal consent on 10/25/2023 for a virtual visit (video or telephone). Tylene Fantasia Ward, PA-C  Date: 10/25/2023 6:22 PM  Virtual Visit via Video Note   I, Tylene Fantasia Ward, connected with  [name]  ([id number], 1966/11/10) on 10/25/23 at  6:15 PM EST by a video-enabled telemedicine application and verified that I am speaking with the correct person using two identifiers.  Location: Patient: Virtual Visit Location Patient:  Home Provider: Virtual Visit Location Provider: Home Office   I discussed the limitations of evaluation and management by telemedicine and the availability of in person appointments. The patient expressed understanding and agreed to proceed.    History of Present Illness: [name] is a 57 y.o. who identifies as a male who was assigned male at birth, and is being seen today for left knee pain that started several days ago. Pain is worse with weight bearing.  No injury or trauma.  He has taken three tablets of prednisone that helped, has no more medications.  He denies swelling or redness.  Pt reports he also needs a refill of his amlodipine, he currently does not have a PCP. Reports his is able to heck his BP at home and it has been well controlled on amlodipine.   HPI: HPI  Problems:  Patient Active Problem List   Diagnosis Date Noted   Hyperglycemia 07/25/2020   NAFL (nonalcoholic fatty liver) 02/04/2017   HTN (hypertension) 07/01/2011   Chronic seasonal allergic rhinitis due to pollen 07/01/2011   GERD (gastroesophageal reflux disease) 07/01/2011   HLD (hyperlipidemia) 07/01/2011    Allergies:  Allergies  Allergen Reactions   Doxycycline Swelling   Pollen Extract Other (See Comments)    Sneezing.   Zocor [Simvastatin] Other (See Comments)    Stomach Cramps    Medications:  Current Outpatient Medications:    amLODipine (NORVASC) 10 MG tablet, Take 1 tablet (10 mg total) by mouth daily., Disp: 90 tablet, Rfl: 1  predniSONE (DELTASONE) 20 MG tablet, Take 2 tablets (40 mg total) by mouth daily with breakfast for 5 days., Disp: 10 tablet, Rfl: 0   cyclobenzaprine (FLEXERIL) 10 MG tablet, Take 1 tablet (10 mg total) by mouth 3 (three) times daily as needed for muscle spasms., Disp: 30 tablet, Rfl: 0   fluticasone (FLONASE) 50 MCG/ACT nasal spray, Place 2 sprays into both nostrils daily., Disp: 16 g, Rfl: 0   naproxen (NAPROSYN) 500 MG tablet, Take 1 tablet (500 mg total) by  mouth 2 (two) times daily with a meal., Disp: 30 tablet, Rfl: 0  Observations/Objective: Patient is well-developed, well-nourished in no acute distress.  Resting comfortably at home.  Head is normocephalic, atraumatic.  No labored breathing.  Speech is clear and coherent with logical content.  Patient is alert and oriented at baseline.    Assessment and Plan: 1. Acute pain of left knee  2. Medication refill  Will prescribed prednisone course.  Advised follow up with PCP or ortho if he experiences recurrence of pain.   Amlodipine refilled today, pt currently does note have a PCP.  HTN well controlled on amlodipine.  Pt reports he is able to check bp at home.  Discussed importance of follow up with PCP.   Follow Up Instructions: I discussed the assessment and treatment plan with the patient. The patient was provided an opportunity to ask questions and all were answered. The patient agreed with the plan and demonstrated an understanding of the instructions.  A copy of instructions were sent to the patient via MyChart unless otherwise noted below.     The patient was advised to call back or seek an in-person evaluation if the symptoms worsen or if the condition fails to improve as anticipated.    Tylene Fantasia Ward, PA-C

## 2023-10-25 NOTE — Patient Instructions (Signed)
[name], thank you for joining Tylene Fantasia Ward, PA-C for today's virtual visit.  While this provider is not your primary care provider (PCP), if your PCP is located in our provider database this encounter information will be shared with them immediately following your visit.   A [facility] MyChart account gives you access to today's visit and all your visits, tests, and labs performed at [facility] " click here if you don't have a [facility] MyChart account or go to [email or web address]  Consent: (Patient) [name] provided verbal consent for this virtual visit at the beginning of the encounter.  Current Medications:  Current Outpatient Medications:    amLODipine (NORVASC) 10 MG tablet, Take 1 tablet (10 mg total) by mouth daily., Disp: 90 tablet, Rfl: 1   predniSONE (DELTASONE) 20 MG tablet, Take 2 tablets (40 mg total) by mouth daily with breakfast for 5 days., Disp: 10 tablet, Rfl: 0   cyclobenzaprine (FLEXERIL) 10 MG tablet, Take 1 tablet (10 mg total) by mouth 3 (three) times daily as needed for muscle spasms., Disp: 30 tablet, Rfl: 0   fluticasone (FLONASE) 50 MCG/ACT nasal spray, Place 2 sprays into both nostrils daily., Disp: 16 g, Rfl: 0   naproxen (NAPROSYN) 500 MG tablet, Take 1 tablet (500 mg total) by mouth 2 (two) times daily with a meal., Disp: 30 tablet, Rfl: 0   Medications ordered in this encounter:  Meds ordered this encounter  Medications   predniSONE (DELTASONE) 20 MG tablet    Sig: Take 2 tablets (40 mg total) by mouth daily with breakfast for 5 days.    Dispense:  10 tablet    Refill:  0    Order Specific Question:   Supervising Provider    Answer:   Merrilee Jansky [[id number]]   amLODipine (NORVASC) 10 MG tablet    Sig: Take 1 tablet (10 mg total) by mouth daily.    Dispense:  90 tablet    Refill:  1    Order Specific Question:   Supervising Provider    Answer:   Merrilee Jansky [id number]     *If you need refills on other  medications prior to your next appointment, please contact your pharmacy*  Follow-Up: Call back or seek an in-person evaluation if the symptoms worsen or if the condition fails to improve as anticipated.  [facility] [phone]  Other Instructions  Take prednisone as prescribed.  I do not recommend repeating this too frequently.  Apply ice, rest, a compressive sleeve may be helpful.  If symptoms return follow up for in person evaluation.   I have sent in refills of you amlodipine.  Continue to monitor your BP at home.  Recommend finding a primary care physician for further management.    If you have been instructed to have an in-person evaluation today at a local Urgent Care facility, please use the link below. It will take you to a list of all of our available [facility], including address, phone number and hours of operation. Please do not delay care.  [facility]  If you or a family member do not have a primary care provider, use the link below to schedule a visit and establish care. When you choose a [facility] primary care physician or advanced practice provider, you gain a long-term partner in health. Find a Primary Care Provider  Learn more about [facility]'s in-office and virtual care options:  - Get Care  Now

## 2024-06-19 ENCOUNTER — Telehealth: Payer: Self-pay | Admitting: Physician Assistant

## 2024-06-19 DIAGNOSIS — L309 Dermatitis, unspecified: Secondary | ICD-10-CM

## 2024-06-19 DIAGNOSIS — I10 Essential (primary) hypertension: Secondary | ICD-10-CM

## 2024-06-19 MED ORDER — AMLODIPINE BESYLATE 10 MG PO TABS: 10.0000 mg | ORAL_TABLET | Freq: Every day | ORAL | 0 refills | Status: AC

## 2024-06-19 MED ORDER — PREDNISONE 20 MG PO TABS: 40.0000 mg | ORAL_TABLET | Freq: Every day | ORAL | 0 refills | Status: DC

## 2024-06-19 MED ORDER — TRIAMCINOLONE ACETONIDE 0.1 % EX CREA: 1.0000 | TOPICAL_CREAM | Freq: Two times a day (BID) | CUTANEOUS | 0 refills | Status: DC

## 2024-06-19 NOTE — Progress Notes (Signed)
 Virtual Visit Consent   [name], you are scheduled for a virtual visit with a [facility] provider today. Just as with appointments in the office, your consent must be obtained to participate. Your consent will be active for this visit and any virtual visit you may have with one of our providers in the next 365 days. If you have a MyChart account, a copy of this consent can be sent to you electronically.  As this is a virtual visit, video technology does not allow for your provider to perform a traditional examination. This may limit your provider's ability to fully assess your condition. If your provider identifies any concerns that need to be evaluated in person or the need to arrange testing (such as labs, EKG, etc.), we will make arrangements to do so. Although advances in technology are sophisticated, we cannot ensure that it will always work on either your end or our end. If the connection with a video visit is poor, the visit may have to be switched to a telephone visit. With either a video or telephone visit, we are not always able to ensure that we have a secure connection.  By engaging in this virtual visit, you consent to the provision of healthcare and authorize for your insurance to be billed (if applicable) for the services provided during this visit. Depending on your insurance coverage, you may receive a charge related to this service.  I need to obtain your verbal consent now. Are you willing to proceed with your visit today? [name] has provided verbal consent on 06/19/2024 for a virtual visit (video or telephone). Keith CHRISTELLA Dickinson, [name]  Date: 06/19/2024 4:40 PM   Virtual Visit via Video Note   IDelon CHRISTELLA Buckley, connected with  [name]  ([id number], 1966-11-02) on 06/19/24 at  4:30 PM EDT by a video-enabled telemedicine application and verified that I am speaking with the correct person using two identifiers.  Location: Patient: Virtual Visit Location  Patient: Home Provider: Virtual Visit Location Provider: Home Office   I discussed the limitations of evaluation and management by telemedicine and the availability of in person appointments. The patient expressed understanding and agreed to proceed.    History of Present Illness: [name] is a 58 y.o. who identifies as a male who was assigned male at birth, and is being seen today for rash.  HPI: Rash This is a new problem. The current episode started in the past 7 days. The problem has been gradually worsening since onset. The affected locations include the left upper leg. The rash is characterized by redness and itchiness. He was exposed to nothing. Pertinent negatives include no cough, facial edema, fatigue, fever or shortness of breath. Treatments tried: pramosone.   Also needing a refill of his Amlodipine . Last filled 10/2023 for 180 days.    Problems:  Patient Active Problem List   Diagnosis Date Noted   Hyperglycemia 07/25/2020   NAFL (nonalcoholic fatty liver) 02/04/2017   HTN (hypertension) 07/01/2011   Chronic seasonal allergic rhinitis due to pollen 07/01/2011   GERD (gastroesophageal reflux disease) 07/01/2011   HLD (hyperlipidemia) 07/01/2011    Allergies:  Allergies  Allergen Reactions   Doxycycline  Swelling   Pollen Extract Other (See Comments)    Sneezing.   Zocor  [Simvastatin ] Other (See Comments)    Stomach Cramps    Medications:  Current Outpatient Medications:    predniSONE  (DELTASONE ) 20 MG tablet, Take 2 tablets (40 mg total) by mouth daily with breakfast., Disp: 10  tablet, Rfl: 0   triamcinolone cream (KENALOG) 0.1 %, Apply 1 Application topically 2 (two) times daily., Disp: 30 g, Rfl: 0   amLODipine  (NORVASC ) 10 MG tablet, Take 1 tablet (10 mg total) by mouth daily., Disp: 30 tablet, Rfl: 0  Observations/Objective: Patient is well-developed, well-nourished in no acute distress.  Resting comfortably at home.  Head is normocephalic, atraumatic.   No labored breathing.  Speech is clear and coherent with logical content.  Patient is alert and oriented at baseline.  Large erythematous and pruritic patch with irregular borders noted on the left lateral upper thigh extending around to posterior side   Assessment and Plan: 1. Eczema, unspecified type (Primary) - triamcinolone cream (KENALOG) 0.1 %; Apply 1 Application topically 2 (two) times daily.  Dispense: 30 g; Refill: 0 - predniSONE  (DELTASONE ) 20 MG tablet; Take 2 tablets (40 mg total) by mouth daily with breakfast.  Dispense: 10 tablet; Refill: 0  2. Primary hypertension - amLODipine  (NORVASC ) 10 MG tablet; Take 1 tablet (10 mg total) by mouth daily.  Dispense: 30 tablet; Refill: 0  - Suspect eczema as he has had this previously - Will prescribe Prednisone  40mg  x 5 days and Triamcinolone cream - May use topical benadryl cream and/or calamine lotion for itching - Cool compresses - Luke warm to cool showers - Amlodipine  also refilled x 30 days, needs to follow up with PCP or in person for any further refills - Seek in person evaluation if rash continues to spread or if any appear to become infected   Follow Up Instructions: I discussed the assessment and treatment plan with the patient. The patient was provided an opportunity to ask questions and all were answered. The patient agreed with the plan and demonstrated an understanding of the instructions.  A copy of instructions were sent to the patient via MyChart unless otherwise noted below.   Patient has requested to receive PHI (AVS, Work Notes, etc) pertaining to this video visit through e-mail as they are currently without active MyChart. They have voiced understand that email is not considered secure and their health information could be viewed by someone other than the patient.   The patient was advised to call back or seek an in-person evaluation if the symptoms worsen or if the condition fails to improve as anticipated.     Keith CHRISTELLA Dickinson, [name]

## 2024-06-19 NOTE — Patient Instructions (Signed)
 [name], thank you for joining Delon Keith Dickinson, PA-C for today's virtual visit.  While this provider is not your primary care provider (PCP), if your PCP is located in our provider database this encounter information will be shared with them immediately following your visit.   [name] MyChart account gives you access to today's visit and all your visits, tests, and labs performed at [facility]  click here if you don't have a [facility] MyChart account or go to [email or web address]  Consent: (Patient) [name] provided verbal consent for this virtual visit at the beginning of the encounter.  Current Medications:  Current Outpatient Medications:    predniSONE  (DELTASONE ) 20 MG tablet, Take 2 tablets (40 mg total) by mouth daily with breakfast., Disp: 10 tablet, Rfl: 0   triamcinolone cream (KENALOG) 0.1 %, Apply 1 Application topically 2 (two) times daily., Disp: 30 g, Rfl: 0   amLODipine  (NORVASC ) 10 MG tablet, Take 1 tablet (10 mg total) by mouth daily., Disp: 30 tablet, Rfl: 0   Medications ordered in this encounter:  Meds ordered this encounter  Medications   triamcinolone cream (KENALOG) 0.1 %    Sig: Apply 1 Application topically 2 (two) times daily.    Dispense:  30 g    Refill:  0    Supervising Provider:   BLAISE ALEENE Buckley [[id number]]   predniSONE  (DELTASONE ) 20 MG tablet    Sig: Take 2 tablets (40 mg total) by mouth daily with breakfast.    Dispense:  10 tablet    Refill:  0    Supervising Provider:   BLAISE ALEENE Buckley [[id number]]   amLODipine  (NORVASC ) 10 MG tablet    Sig: Take 1 tablet (10 mg total) by mouth daily.    Dispense:  30 tablet    Refill:  0    Supervising Provider:   LAMPTEY, Keith O [[id number]]     *If you need refills on other medications prior to your next appointment, please contact your pharmacy*  Follow-Up: Call back or seek an in-person evaluation if the symptoms worsen or if the condition fails to improve as  anticipated.  [facility] [phone]  Other Instructions Itchy, Irritated Skin (Eczema): What to Know Eczema is a group of skin conditions that cause rough and inflamed skin. There are different types of eczema. They each have different triggers, symptoms, and treatments. Eczema is not contagious, so it doesn't spread from person to person. It can appear on different parts of your body at different times. It doesn't look the same on everyone. What are the causes? The exact cause of eczema isn't known. Things that can make it worse includes: Irritants. Allergens. What are the signs or symptoms? Symptoms depend on the type of eczema you have. They can range from mild to very bad. Symptoms often include: Itchiness. Dry skin. Rash or skin bumps. Swelling. Crusty, flaky, or scaly patches. Thick patches of skin. Oozing skin or blisters. How is this diagnosed? This condition may be diagnosed based on: Symptoms. Physical exam. Medical history. Skin patch tests that use allergen patches on your back to check for allergic reactions. You may need to see a skin specialist called a dermatologist. This specialist can help diagnose and treat this condition. How is this treated? There's no cure for eczema, but you can manage your symptoms. Treatment depends on the type of eczema you have. Options may include: Medicines to lessen itching (antihistamines). Medicine to put on your skin to lessen swelling  and irritation (corticosteroid creams or ointments). Light therapy, also called phototherapy. The affected skin is put under ultraviolet (UV) light. Medicines may be prescribed or purchased at the store. This will depend on the strength that's needed. Follow these instructions at home: Skin Care Use skin creams or lotions as told. Do not scratch your skin. This can make your rash worse. Keep fingernails short to avoid scratching open the skin. General instructions Take or apply  your medicines as told. Avoid triggers and allergens. Treat symptoms fast if you have a flare-up. Keep all follow-up visits to make sure your treatment plan is working. Where to find more information American Academy of Dermatology: MarketingSheets.si National Eczema Association: nationaleczema.org The Society for Pediatric Dermatology: pedsderm.net Contact a health care provider if: You have very bad itching even with treatment. You often scratch your skin until it bleeds. Your rash looks different than normal. You have a fever. You have symptoms that don't go away with treatment. You have more redness, pain, or swelling over the affected skin. You have warmth or pus coming from the affected skin. This information is not intended to replace advice given to you by your health care provider. Make sure you discuss any questions you have with your health care provider. Document Revised: 05/11/2023 Document Reviewed: 05/11/2023 Elsevier Patient Education  2024 Elsevier Inc.   If you have been instructed to have an in-person evaluation today at a local Urgent Care facility, please use the link below. It will take you to a list of all of our available [facility], including address, phone number and hours of operation. Please do not delay care.  [facility]  If you or a family member do not have a primary care provider, use the link below to schedule a visit and establish care. When you choose a [facility] primary care physician or advanced practice provider, you gain a long-term partner in health. Find a Primary Care Provider  Learn more about [facility]'s in-office and virtual care options: [place] - Get Care Now

## 2024-09-11 ENCOUNTER — Telehealth: Payer: Self-pay | Admitting: Physician Assistant

## 2024-09-11 DIAGNOSIS — R140 Abdominal distension (gaseous): Secondary | ICD-10-CM

## 2024-09-11 DIAGNOSIS — M25562 Pain in left knee: Secondary | ICD-10-CM

## 2024-09-11 MED ORDER — SACCHAROMYCES BOULARDII 250 MG PO CAPS: 250.0000 mg | ORAL_CAPSULE | Freq: Two times a day (BID) | ORAL | 0 refills | Status: DC

## 2024-09-11 MED ORDER — PREDNISONE 20 MG PO TABS: 40.0000 mg | ORAL_TABLET | Freq: Every day | ORAL | 0 refills | Status: DC

## 2024-09-11 NOTE — Patient Instructions (Signed)
  Dorn Mater, thank you for joining [name], PA-C for today's virtual visit.  While this provider is not your primary care provider (PCP), if your PCP is located in our provider database this encounter information will be shared with them immediately following your visit.   A [facility] MyChart account gives you access to today's visit and all your visits, tests, and labs performed at [facility]  click here if you don't have a [facility] MyChart account or [place] to [email or web address]  Consent: (Patient) [name] provided verbal consent for this virtual visit at the beginning of the encounter.  Current Medications:  Current Outpatient Medications:    amLODipine  (NORVASC ) 10 MG tablet, Take 1 tablet (10 mg total) by mouth daily., Disp: 30 tablet, Rfl: 0   predniSONE  (DELTASONE ) 20 MG tablet, Take 2 tablets (40 mg total) by mouth daily with breakfast., Disp: 10 tablet, Rfl: 0   triamcinolone  cream (KENALOG ) 0.1 %, Apply 1 Application topically 2 (two) times daily., Disp: 30 g, Rfl: 0   Medications ordered in this encounter:  No orders of the defined types were placed in this encounter.    *If you need refills on other medications prior to your next appointment, please contact your pharmacy*  Follow-Up: Call back or seek an in-person evaluation if the symptoms worsen or if the condition fails to improve as anticipated.  [facility] [phone]  Other Instructions Please hydrate. Try to limit processed foods and stick with whole fruits and vegetables when cooking at home.  Keep a food journal as discussed. Start the Florastor as directed. Consider adding some fermented foods to your diet as well. Use the link below to get established with a primary care provider for further assessment and management.  For the knee, elevate it while resting. Ice as needed if you note any swelling. Take the prescribed medications as directed. If you  note any non-resolving, new, or worsening symptoms despite treatment, please seek an in-person evaluation ASAP.    If you have been instructed to have an in-person evaluation today at a local Urgent Care facility, please use the link below. It will take you to a list of all of our available [facility], including address, phone number and hours of operation. Please do not delay care.  [facility]  If you or a family member do not have a primary care provider, use the link below to schedule a visit and establish care. When you choose a [facility] primary care physician or advanced practice provider, you gain a long-term partner in health. Find a Primary Care Provider  Learn more about [facility]'s in-office and virtual care options:  - Get Care Now

## 2024-09-11 NOTE — Progress Notes (Signed)
 Virtual Visit Consent   [name], you are scheduled for a virtual visit with a [facility] provider today. Just as with appointments in the office, your consent must be obtained to participate. Your consent will be active for this visit and any virtual visit you may have with one of our providers in the next 365 days. If you have a MyChart account, a copy of this consent can be sent to you electronically.  As this is a virtual visit, video technology does not allow for your provider to perform a traditional examination. This may limit your provider's ability to fully assess your condition. If your provider identifies any concerns that need to be evaluated in person or the need to arrange testing (such as labs, EKG, etc.), we will make arrangements to do so. Although advances in technology are sophisticated, we cannot ensure that it will always work on either your end or our end. If the connection with a video visit is poor, the visit may have to be switched to a telephone visit. With either a video or telephone visit, we are not always able to ensure that we have a secure connection.  By engaging in this virtual visit, you consent to the provision of healthcare and authorize for your insurance to be billed (if applicable) for the services provided during this visit. Depending on your insurance coverage, you may receive a charge related to this service.  I need to obtain your verbal consent now. Are you willing to proceed with your visit today? [name] has provided verbal consent on 09/11/2024 for a virtual visit (video or telephone). [name], [place]  Date: 09/11/2024 6:58 PM   Virtual Visit via Video Note   I, [name], connected with  Franco Duley  ([id number], 08-29-1966) on 09/11/24 at  6:45 PM EDT by a video-enabled telemedicine application and verified that I am speaking with the correct person using two identifiers.  Location: Patient: Virtual Visit Location  Patient: Home Provider: Virtual Visit Location Provider: Home Office   I discussed the limitations of evaluation and management by telemedicine and the availability of in person appointments. The patient expressed understanding and agreed to proceed.    History of Present Illness: [name] is a 58 y.o. who identifies as a male who was assigned male at birth, and is being seen today for multiple complaints.  Endorses pain of L knee and ankle over the past 2 days with tension and tenderness. Has had episodes in the past of similar issues. Denies any trauma or injury. Denies redness or warmth around the knee. Denies appreciable swelling. Notes the pain is a soreness.  Had some old prednisone  that he took a dose of with some improvement. No other OTC medications used so far with this issue.   Patient also noting intermittent issues with bloating, gaseousness and stool changes. Notes this is mainly with eating an American diet as he eats similar foods when visiting family in [place] without any abdominal symptoms. Was told by a doctor in [place] that he is likely reacting to things in our foods that are not allowed in [place]. Is wanting to know what steps he can take to limit symptoms while here in the [place] , as well as if there are concerns for anything else going on. Denies fevers, weight changes.   HPI: HPI  Problems:  Patient Active Problem List   Diagnosis Date Noted   Hyperglycemia 07/25/2020   NAFL (nonalcoholic fatty liver) 02/04/2017   HTN (  hypertension) 07/01/2011   Chronic seasonal allergic rhinitis due to pollen 07/01/2011   GERD (gastroesophageal reflux disease) 07/01/2011   HLD (hyperlipidemia) 07/01/2011    Allergies:  Allergies  Allergen Reactions   Doxycycline  Swelling   Pollen Extract Other (See Comments)    Sneezing.   Zocor  [Simvastatin ] Other (See Comments)    Stomach Cramps    Medications:  Current Outpatient Medications:    predniSONE  (DELTASONE ) 20 MG tablet, Take 2  tablets (40 mg total) by mouth daily with breakfast., Disp: 10 tablet, Rfl: 0   saccharomyces boulardii (FLORASTOR) 250 MG capsule, Take 1 capsule (250 mg total) by mouth 2 (two) times daily., Disp: 60 capsule, Rfl: 0   amLODipine  (NORVASC ) 10 MG tablet, Take 1 tablet (10 mg total) by mouth daily., Disp: 30 tablet, Rfl: 0   triamcinolone  cream (KENALOG ) 0.1 %, Apply 1 Application topically 2 (two) times daily., Disp: 30 g, Rfl: 0  Observations/Objective: Patient is well-developed, well-nourished in no acute distress.  Resting comfortably at home.  Head is normocephalic, atraumatic.  No labored breathing.  Speech is clear and coherent with logical content.  Patient is alert and oriented at baseline.   Assessment and Plan: 1. Bloating (Primary) - saccharomyces boulardii (FLORASTOR) 250 MG capsule; Take 1 capsule (250 mg total) by mouth 2 (two) times daily.  Dispense: 60 capsule; Refill: 0  Along with gaseousness. I do agree that likely related to standard american diet. Recommend trying to eat a clean diet and limiting processed foods as much as possible. Start florastor as directed. Discussed prebiotic and probiotic foods to implement. Resources given to get him set up with a PCP for testing of food sensitivities, etc.   2. Acute pain of left knee - predniSONE  (DELTASONE ) 20 MG tablet; Take 2 tablets (40 mg total) by mouth daily with breakfast.  Dispense: 10 tablet; Refill: 0  Atraumatic. Suspect overuse. Ice and elevate leg while resting. Prednisone  per orders. Ok to use OTC tylenol . Will need an in-person evaluation for any non-resolving, new or worsening symptoms despite treatment.   Follow Up Instructions: I discussed the assessment and treatment plan with the patient. The patient was provided an opportunity to ask questions and all were answered. The patient agreed with the plan and demonstrated an understanding of the instructions.  A copy of instructions were sent to the patient via  MyChart unless otherwise noted below.   The patient was advised to call back or seek an in-person evaluation if the symptoms worsen or if the condition fails to improve as anticipated.    Keith Velma Lunger, PA-C

## 2025-01-03 ENCOUNTER — Telehealth: Payer: Self-pay | Admitting: Physician Assistant

## 2025-01-03 DIAGNOSIS — J208 Acute bronchitis due to other specified organisms: Secondary | ICD-10-CM

## 2025-01-03 DIAGNOSIS — B9689 Other specified bacterial agents as the cause of diseases classified elsewhere: Secondary | ICD-10-CM

## 2025-01-03 MED ORDER — PROMETHAZINE-DM 6.25-15 MG/5ML PO SYRP: 5.0000 mL | ORAL_SOLUTION | Freq: Four times a day (QID) | ORAL | 0 refills | Status: DC | PRN

## 2025-01-03 MED ORDER — ALBUTEROL SULFATE HFA 108 (90 BASE) MCG/ACT IN AERS: 1.0000 | INHALATION_SPRAY | Freq: Four times a day (QID) | RESPIRATORY_TRACT | 0 refills | Status: AC | PRN

## 2025-01-03 MED ORDER — PREDNISONE 20 MG PO TABS: 40.0000 mg | ORAL_TABLET | Freq: Every day | ORAL | 0 refills | Status: DC

## 2025-01-03 MED ORDER — AZITHROMYCIN 250 MG PO TABS: ORAL_TABLET | ORAL | 0 refills | Status: AC

## 2025-01-03 NOTE — Patient Instructions (Signed)
 " [name], thank you for joining [name], PA-C for today's virtual visit.  While this provider is not your primary care provider (PCP), if your PCP is located in our provider database this encounter information will be shared with them immediately following your visit.   A [facility] MyChart account gives you access to today's visit and all your visits, tests, and labs performed at [facility]  click here if you don't have a [facility] MyChart account or go to [email or web address]  Consent: (Patient) [name] provided verbal consent for this virtual visit at the beginning of the encounter.  Current Medications:  Current Outpatient Medications:    albuterol  (VENTOLIN  HFA) 108 (90 Base) MCG/ACT inhaler, Inhale 1-2 puffs into the lungs every 6 (six) hours as needed., Disp: 8 g, Rfl: 0   azithromycin  (ZITHROMAX ) 250 MG tablet, Take 2 tablets on day 1, then 1 tablet daily on days 2 through 5, Disp: 6 tablet, Rfl: 0   predniSONE  (DELTASONE ) 20 MG tablet, Take 2 tablets (40 mg total) by mouth daily with breakfast., Disp: 10 tablet, Rfl: 0   promethazine -dextromethorphan (PROMETHAZINE -DM) 6.25-15 MG/5ML syrup, Take 5 mLs by mouth 4 (four) times daily as needed., Disp: 118 mL, Rfl: 0   amLODipine  (NORVASC ) 10 MG tablet, Take 1 tablet (10 mg total) by mouth daily., Disp: 30 tablet, Rfl: 0   saccharomyces boulardii (FLORASTOR) 250 MG capsule, Take 1 capsule (250 mg total) by mouth 2 (two) times daily., Disp: 60 capsule, Rfl: 0   Medications ordered in this encounter:  Meds ordered this encounter  Medications   azithromycin  (ZITHROMAX ) 250 MG tablet    Sig: Take 2 tablets on day 1, then 1 tablet daily on days 2 through 5    Dispense:  6 tablet    Refill:  0    Supervising Provider:   LAMPTEY, PHILIP O [[id number]]   promethazine -dextromethorphan (PROMETHAZINE -DM) 6.25-15 MG/5ML syrup    Sig: Take 5 mLs by mouth 4 (four) times daily as needed.    Dispense:  118 mL     Refill:  0    Supervising Provider:   BLAISE ALEENE KIDD [[id number]]   predniSONE  (DELTASONE ) 20 MG tablet    Sig: Take 2 tablets (40 mg total) by mouth daily with breakfast.    Dispense:  10 tablet    Refill:  0    Supervising Provider:   BLAISE ALEENE KIDD [[id number]]   albuterol  (VENTOLIN  HFA) 108 (90 Base) MCG/ACT inhaler    Sig: Inhale 1-2 puffs into the lungs every 6 (six) hours as needed.    Dispense:  8 g    Refill:  0    Supervising Provider:   BLAISE ALEENE KIDD [[id number]]     *If you need refills on other medications prior to your next appointment, please contact your pharmacy*  Follow-Up: Call back or seek an in-person evaluation if the symptoms worsen or if the condition fails to improve as anticipated.  [facility] [phone]  Other Instructions Acute Bronchitis, Adult  Acute bronchitis is sudden inflammation of the main airways (bronchi) that come off the windpipe (trachea) in the lungs. The swelling causes the airways to get smaller and make more mucus than normal. This can make it hard to breathe and can cause coughing or noisy breathing (wheezing). Acute bronchitis may last several weeks. The cough may last longer. Allergies, asthma, and exposure to smoke may make the condition worse. What are the causes? This condition can be  caused by germs and by substances that irritate the lungs, including: Cold and flu viruses. The most common cause of this condition is the virus that causes the common cold. Bacteria. This is less common. Breathing in substances that irritate the lungs, including: Smoke from cigarettes and other forms of tobacco. Dust and pollen. Fumes from household cleaning products, gases, or burned fuel. Indoor or outdoor air pollution. What increases the risk? The following factors may make you more likely to develop this condition: A weak body's defense system, also called the immune system. A condition that affects your lungs and  breathing, such as asthma. What are the signs or symptoms? Common symptoms of this condition include: Coughing. This may bring up clear, yellow, or green mucus from your lungs (sputum). Wheezing. Runny or stuffy nose. Having too much mucus in your lungs (chest congestion). Shortness of breath. Aches and pains, including sore throat or chest. How is this diagnosed? This condition is usually diagnosed based on: Your symptoms and medical history. A physical exam. You may also have other tests, including tests to rule out other conditions, such as pneumonia. These tests include: A test of lung function. Test of a mucus sample to look for the presence of bacteria. Tests to check the oxygen level in your blood. Blood tests. Chest X-ray. How is this treated? Most cases of acute bronchitis clear up over time without treatment. Your health care provider may recommend: Drinking more fluids to help thin your mucus so it is easier to cough up. Taking inhaled medicine (inhaler) to improve air flow in and out of your lungs. Using a vaporizer or a humidifier. These are machines that add water to the air to help you breathe better. Taking a medicine that thins mucus and clears congestion (expectorant). Taking a medicine that prevents or stops coughing (cough suppressant). It is not common to take an antibiotic medicine for this condition. Follow these instructions at home:  Take over-the-counter and prescription medicines only as told by your health care provider. Use an inhaler, vaporizer, or humidifier as told by your health care provider. Take two teaspoons (10 mL) of honey at bedtime to lessen coughing at night. Drink enough fluid to keep your urine pale yellow. Do not use any products that contain nicotine or tobacco. These products include cigarettes, chewing tobacco, and vaping devices, such as e-cigarettes. If you need help quitting, ask your health care provider. Get plenty of  rest. Return to your normal activities as told by your health care provider. Ask your health care provider what activities are safe for you. Keep all follow-up visits. This is important. How is this prevented? To lower your risk of getting this condition again: Wash your hands often with soap and water for at least 20 seconds. If soap and water are not available, use hand sanitizer. Avoid contact with people who have cold symptoms. Try not to touch your mouth, nose, or eyes with your hands. Avoid breathing in smoke or chemical fumes. Breathing smoke or chemical fumes will make your condition worse. Get the flu shot every year. Contact a health care provider if: Your symptoms do not improve after 2 weeks. You have trouble coughing up the mucus. Your cough keeps you awake at night. You have a fever. Get help right away if you: Cough up blood. Feel pain in your chest. Have severe shortness of breath. Faint or keep feeling like you are going to faint. Have a severe headache. Have a fever or chills that get  worse. These symptoms may represent a serious problem that is an emergency. Do not wait to see if the symptoms will go away. Get medical help right away. Call your local emergency services (911 in the [place] Do not drive yourself to the hospital. Summary Acute bronchitis is inflammation of the main airways (bronchi) that come off the windpipe (trachea) in the lungs. The swelling causes the airways to get smaller and make more mucus than normal. Drinking more fluids can help thin your mucus so it is easier to cough up. Take over-the-counter and prescription medicines only as told by your health care provider. Do not use any products that contain nicotine or tobacco. These products include cigarettes, chewing tobacco, and vaping devices, such as e-cigarettes. If you need help quitting, ask your health care provider. Contact a health care provider if your symptoms do not improve after 2  weeks. This information is not intended to replace advice given to you by your health care provider. Make sure you discuss any questions you have with your health care provider. Document Revised: 03/19/2022 Document Reviewed: 04/09/2021 Elsevier Patient Education  2024 Elsevier Inc.   If you have been instructed to have an in-person evaluation today at a local Urgent Care facility, please use the link below. It will take you to a list of all of our available [facility], including address, phone number and hours of operation. Please do not delay care.  [facility]  If you or a family member do not have a primary care provider, use the link below to schedule a visit and establish care. When you choose a [facility] primary care physician or advanced practice provider, you gain a long-term partner in health. Find a Primary Care Provider  Learn more about [facility]'s in-office and virtual care options: [facility] - Get Care Now "

## 2025-01-03 NOTE — Progress Notes (Signed)
 " Virtual Visit Consent   [name], you are scheduled for a virtual visit with a [facility] provider today. Just as with appointments in the office, your consent must be obtained to participate. Your consent will be active for this visit and any virtual visit you may have with one of our providers in the next 365 days. If you have a MyChart account, a copy of this consent can be sent to you electronically.  As this is a virtual visit, video technology does not allow for your provider to perform a traditional examination. This may limit your provider's ability to fully assess your condition. If your provider identifies any concerns that need to be evaluated in person or the need to arrange testing (such as labs, EKG, etc.), we will make arrangements to do so. Although advances in technology are sophisticated, we cannot ensure that it will always work on either your end or our end. If the connection with a video visit is poor, the visit may have to be switched to a telephone visit. With either a video or telephone visit, we are not always able to ensure that we have a secure connection.  By engaging in this virtual visit, you consent to the provision of healthcare and authorize for your insurance to be billed (if applicable) for the services provided during this visit. Depending on your insurance coverage, you may receive a charge related to this service.  I need to obtain your verbal consent now. Are you willing to proceed with your visit today? [name] has provided verbal consent on 01/03/2025 for a virtual visit (video or telephone). [name], PA-C  Date: 01/03/2025 3:13 PM   Virtual Visit via Video Note   I, [name], connected with  [name]  ([id number], May 29, 1966) on 01/03/2025 at  3:00 PM EST by a video-enabled telemedicine application and verified that I am speaking with the correct person using two identifiers.  Location: Patient: Virtual Visit Location  Patient: Home Provider: Virtual Visit Location Provider: Home Office   I discussed the limitations of evaluation and management by telemedicine and the availability of in person appointments. The patient expressed understanding and agreed to proceed.    History of Present Illness: [name] is a 59 y.o. who identifies as a male who was assigned male at birth, and is being seen today for cough and congestion.  HPI: URI  This is a new problem. The current episode started in the past 7 days (4 days). The problem has been gradually worsening. Maximum temperature: subjective fevers yesterday; woke up sweating this morning. The fever has been present for 1 to 2 days. Associated symptoms include chest pain (tightness), congestion, coughing (dry), headaches, rhinorrhea (and post nasal drainage), sinus pain, sneezing and wheezing (worse with lying down). Pertinent negatives include no diarrhea, ear pain, nausea, plugged ear sensation, sore throat or vomiting. Treatments tried: sudafed, theraflu, tylenol , ibuprofen. The treatment provided no relief.     Problems:  Patient Active Problem List   Diagnosis Date Noted   Hyperglycemia 07/25/2020   NAFL (nonalcoholic fatty liver) 02/04/2017   HTN (hypertension) 07/01/2011   Chronic seasonal allergic rhinitis due to pollen 07/01/2011   GERD (gastroesophageal reflux disease) 07/01/2011   HLD (hyperlipidemia) 07/01/2011    Allergies: Allergies[1] Medications: Current Medications[2]  Observations/Objective: Patient is well-developed, well-nourished in no acute distress.  Resting comfortably at home.  Head is normocephalic, atraumatic.  No labored breathing.  Speech is clear and coherent with logical content.  Patient is alert and oriented at baseline.  Deep, harsh, barking cough heard a few times through call  Assessment and Plan: 1. Acute bacterial bronchitis (Primary) - azithromycin  (ZITHROMAX ) 250 MG tablet; Take 2 tablets on day 1, then 1  tablet daily on days 2 through 5  Dispense: 6 tablet; Refill: 0 - promethazine -dextromethorphan (PROMETHAZINE -DM) 6.25-15 MG/5ML syrup; Take 5 mLs by mouth 4 (four) times daily as needed.  Dispense: 118 mL; Refill: 0 - predniSONE  (DELTASONE ) 20 MG tablet; Take 2 tablets (40 mg total) by mouth daily with breakfast.  Dispense: 10 tablet; Refill: 0 - albuterol  (VENTOLIN  HFA) 108 (90 Base) MCG/ACT inhaler; Inhale 1-2 puffs into the lungs every 6 (six) hours as needed.  Dispense: 8 g; Refill: 0  - Worsening over a week despite OTC medications - Will treat with Z-pack, Prednisone , Promethazine  DM, and Albuterol  inhaler - Can add Mucinex (PLAIN) during the daytime - Push fluids.  - Rest.  - Steam and humidifier can help - Seek in person evaluation if worsening or symptoms fail to improve    Follow Up Instructions: I discussed the assessment and treatment plan with the patient. The patient was provided an opportunity to ask questions and all were answered. The patient agreed with the plan and demonstrated an understanding of the instructions.  A copy of instructions were sent to the patient via MyChart unless otherwise noted below.    The patient was advised to call back or seek an in-person evaluation if the symptoms worsen or if the condition fails to improve as anticipated.    Kenny Stern M Teoman Giraud, PA-C     [1]  Allergies Allergen Reactions   Doxycycline  Swelling   Pollen Extract Other (See Comments)    Sneezing.   Zocor  [Simvastatin ] Other (See Comments)    Stomach Cramps   [2]  Current Outpatient Medications:    albuterol  (VENTOLIN  HFA) 108 (90 Base) MCG/ACT inhaler, Inhale 1-2 puffs into the lungs every 6 (six) hours as needed., Disp: 8 g, Rfl: 0   azithromycin  (ZITHROMAX ) 250 MG tablet, Take 2 tablets on day 1, then 1 tablet daily on days 2 through 5, Disp: 6 tablet, Rfl: 0   predniSONE  (DELTASONE ) 20 MG tablet, Take 2 tablets (40 mg total) by mouth daily with breakfast., Disp: 10  tablet, Rfl: 0   promethazine -dextromethorphan (PROMETHAZINE -DM) 6.25-15 MG/5ML syrup, Take 5 mLs by mouth 4 (four) times daily as needed., Disp: 118 mL, Rfl: 0   amLODipine  (NORVASC ) 10 MG tablet, Take 1 tablet (10 mg total) by mouth daily., Disp: 30 tablet, Rfl: 0   saccharomyces boulardii (FLORASTOR) 250 MG capsule, Take 1 capsule (250 mg total) by mouth 2 (two) times daily., Disp: 60 capsule, Rfl: 0  "

## 2025-01-19 ENCOUNTER — Telehealth: Payer: Self-pay | Admitting: Physician Assistant

## 2025-01-19 DIAGNOSIS — J4541 Moderate persistent asthma with (acute) exacerbation: Secondary | ICD-10-CM

## 2025-01-19 DIAGNOSIS — J9801 Acute bronchospasm: Secondary | ICD-10-CM

## 2025-01-19 MED ORDER — PREDNISONE 10 MG PO TABS: ORAL_TABLET | ORAL | 0 refills | Status: AC

## 2025-01-19 MED ORDER — BENZONATATE 100 MG PO CAPS: 100.0000 mg | ORAL_CAPSULE | Freq: Three times a day (TID) | ORAL | 0 refills | Status: AC | PRN

## 2025-01-19 NOTE — Progress Notes (Signed)
 " Virtual Visit Consent   [name], you are scheduled for a virtual visit with a [name] provider today. Just as with appointments in the office, your consent must be obtained to participate. Your consent will be active for this visit and any virtual visit you may have with one of our providers in the next 365 days. If you have a MyChart account, a copy of this consent can be sent to you electronically.  As this is a virtual visit, video technology does not allow for your provider to perform a traditional examination. This may limit your provider's ability to fully assess your condition. If your provider identifies any concerns that need to be evaluated in person or the need to arrange testing (such as labs, EKG, etc.), we will make arrangements to do so. Although advances in technology are sophisticated, we cannot ensure that it will always work on either your end or our end. If the connection with a video visit is poor, the visit may have to be switched to a telephone visit. With either a video or telephone visit, we are not always able to ensure that we have a secure connection.  By engaging in this virtual visit, you consent to the provision of healthcare and authorize for your insurance to be billed (if applicable) for the services provided during this visit. Depending on your insurance coverage, you may receive a charge related to this service.  I need to obtain your verbal consent now. Are you willing to proceed with your visit today? [name] has provided verbal consent on 01/19/2025 for a virtual visit (video or telephone). [name], PA-C  Date: 01/19/2025 3:25 PM   Virtual Visit via Video Note   I, [name], connected with  [name]  ([id number], 05-24-1966) on 01/19/25 at  2:30 PM EST by a video-enabled telemedicine application and verified that I am speaking with the correct person using two identifiers.  Location: Patient: Virtual Visit Location  Patient: Home Provider: Virtual Visit Location Provider: Home Office   I discussed the limitations of evaluation and management by telemedicine and the availability of in person appointments. The patient expressed understanding and agreed to proceed.    History of Present Illness: [name] is a 59 y.o. who identifies as a male who was assigned male at birth, and is being seen today for recurrent cough.  HPI: Cough This is a recurrent problem. Episode onset: Seen on 01/03/25, virtually, for same and treated as bacterial bronchitis due to duration with Zpack, Prednisone  40mg  x 5 days, and Promethazine  DM; Sx improved then quickly recurred. The problem has been gradually worsening. The problem occurs every few minutes. The cough is Non-productive. Associated symptoms include shortness of breath and wheezing. Pertinent negatives include no ear congestion, ear pain, fever, nasal congestion, postnasal drip, rhinorrhea or sore throat. The symptoms are aggravated by cold air and lying down. He has tried a beta-agonist inhaler, prescription cough suppressant and oral steroids for the symptoms. The treatment provided moderate (but recurred) relief. His past medical history is significant for bronchitis and environmental allergies.    Problems:  Patient Active Problem List   Diagnosis Date Noted   Hyperglycemia 07/25/2020   NAFL (nonalcoholic fatty liver) 02/04/2017   HTN (hypertension) 07/01/2011   Chronic seasonal allergic rhinitis due to pollen 07/01/2011   GERD (gastroesophageal reflux disease) 07/01/2011   HLD (hyperlipidemia) 07/01/2011    Allergies: Allergies[1] Medications: Current Medications[2]  Observations/Objective: Patient is well-developed, well-nourished in no acute  distress.  Resting comfortably at home.  Head is normocephalic, atraumatic.  No labored breathing.  Speech is clear and coherent with logical content.  Patient is alert and oriented at baseline.  Dry, harsh,  bronchial cough heard a few times through call but not limiting speech  Assessment and Plan: 1. Moderate persistent asthma with exacerbation (Primary) - predniSONE  (DELTASONE ) 10 MG tablet; Days 1-4 take 4 tablets (40 mg) daily  Days 5-8 take 3 tablets (30 mg) daily, Days 9-11 take 2 tablets (20 mg) daily, Days 12-14 take 1 tablet (10 mg) daily.  Dispense: 37 tablet; Refill: 0 - benzonatate  (TESSALON ) 100 MG capsule; Take 1-2 capsules (100-200 mg total) by mouth 3 (three) times daily as needed.  Dispense: 30 capsule; Refill: 0  2. Bronchospasm  - Suspicious of asthma vs reactive airway bronchospasm being triggered by recent URI and cold air (patient has allergies and eczema, so asthma may be undiagnosed) - Add Prednisone  2 week taper as above (improved with 5 day previously, but returned quickly) - Continue Albuterol  inhaler - Add Tessalon  perles for daytime cough as needed - Steam and humidifier can help  - Push fluids - Rest - Seek in person evaluation if worsening or fails to improve  Follow Up Instructions: I discussed the assessment and treatment plan with the patient. The patient was provided an opportunity to ask questions and all were answered. The patient agreed with the plan and demonstrated an understanding of the instructions.  A copy of instructions were sent to the patient via MyChart unless otherwise noted below.    The patient was advised to call back or seek an in-person evaluation if the symptoms worsen or if the condition fails to improve as anticipated.    Juliann Olesky M Leaner Morici, PA-C     [1]  Allergies Allergen Reactions   Doxycycline  Swelling   Pollen Extract Other (See Comments)    Sneezing.   Zocor  [Simvastatin ] Other (See Comments)    Stomach Cramps   [2]  Current Outpatient Medications:    benzonatate  (TESSALON ) 100 MG capsule, Take 1-2 capsules (100-200 mg total) by mouth 3 (three) times daily as needed., Disp: 30 capsule, Rfl: 0   predniSONE   (DELTASONE ) 10 MG tablet, Days 1-4 take 4 tablets (40 mg) daily  Days 5-8 take 3 tablets (30 mg) daily, Days 9-11 take 2 tablets (20 mg) daily, Days 12-14 take 1 tablet (10 mg) daily., Disp: 37 tablet, Rfl: 0   albuterol  (VENTOLIN  HFA) 108 (90 Base) MCG/ACT inhaler, Inhale 1-2 puffs into the lungs every 6 (six) hours as needed., Disp: 8 g, Rfl: 0   amLODipine  (NORVASC ) 10 MG tablet, Take 1 tablet (10 mg total) by mouth daily., Disp: 30 tablet, Rfl: 0  "
